# Patient Record
Sex: Male | Born: 1950 | Race: Black or African American | Hispanic: No | Smoking: Never smoker
Health system: Southern US, Community
[De-identification: ages and names within clinical notes are randomized; demographics above are authoritative.]

---

## 2019-11-18 ENCOUNTER — Encounter (HOSPITAL_COMMUNITY): Payer: Self-pay | Admitting: Emergency Medicine

## 2019-11-18 ENCOUNTER — Other Ambulatory Visit: Payer: Self-pay

## 2019-11-18 ENCOUNTER — Inpatient Hospital Stay (HOSPITAL_COMMUNITY)
Admission: EM | Admit: 2019-11-18 | Discharge: 2019-11-21 | DRG: 193 | Discharge status: Against Medical Advice | Payer: Medicare Other | Attending: Internal Medicine | Admitting: Internal Medicine

## 2019-11-18 ENCOUNTER — Emergency Department (HOSPITAL_COMMUNITY): Payer: Medicare Other

## 2019-11-18 DIAGNOSIS — R739 Hyperglycemia, unspecified: Secondary | ICD-10-CM | POA: Diagnosis present

## 2019-11-18 DIAGNOSIS — E785 Hyperlipidemia, unspecified: Secondary | ICD-10-CM | POA: Diagnosis present

## 2019-11-18 DIAGNOSIS — Z79899 Other long term (current) drug therapy: Secondary | ICD-10-CM

## 2019-11-18 DIAGNOSIS — J189 Pneumonia, unspecified organism: Secondary | ICD-10-CM | POA: Diagnosis not present

## 2019-11-18 DIAGNOSIS — D696 Thrombocytopenia, unspecified: Secondary | ICD-10-CM | POA: Diagnosis present

## 2019-11-18 DIAGNOSIS — J9601 Acute respiratory failure with hypoxia: Secondary | ICD-10-CM | POA: Diagnosis present

## 2019-11-18 DIAGNOSIS — M109 Gout, unspecified: Secondary | ICD-10-CM

## 2019-11-18 DIAGNOSIS — R945 Abnormal results of liver function studies: Secondary | ICD-10-CM

## 2019-11-18 DIAGNOSIS — I10 Essential (primary) hypertension: Secondary | ICD-10-CM

## 2019-11-18 DIAGNOSIS — E872 Acidosis: Secondary | ICD-10-CM | POA: Diagnosis present

## 2019-11-18 DIAGNOSIS — R0902 Hypoxemia: Secondary | ICD-10-CM

## 2019-11-18 DIAGNOSIS — E7800 Pure hypercholesterolemia, unspecified: Secondary | ICD-10-CM

## 2019-11-18 DIAGNOSIS — R7989 Other specified abnormal findings of blood chemistry: Secondary | ICD-10-CM

## 2019-11-18 DIAGNOSIS — Z20822 Contact with and (suspected) exposure to covid-19: Secondary | ICD-10-CM

## 2019-11-18 DIAGNOSIS — R0602 Shortness of breath: Secondary | ICD-10-CM

## 2019-11-18 DIAGNOSIS — D72819 Decreased white blood cell count, unspecified: Secondary | ICD-10-CM | POA: Diagnosis present

## 2019-11-18 DIAGNOSIS — Z20828 Contact with and (suspected) exposure to other viral communicable diseases: Secondary | ICD-10-CM | POA: Diagnosis present

## 2019-11-18 DIAGNOSIS — N179 Acute kidney failure, unspecified: Secondary | ICD-10-CM | POA: Diagnosis present

## 2019-11-18 HISTORY — DX: Essential (primary) hypertension: I10

## 2019-11-18 HISTORY — DX: Gout, unspecified: M10.9

## 2019-11-18 HISTORY — DX: Pure hypercholesterolemia, unspecified: E78.00

## 2019-11-18 LAB — TROPONIN I (HIGH SENSITIVITY): Troponin I (High Sensitivity): 7 ng/L (ref <18)

## 2019-11-18 LAB — CBC WITH DIFFERENTIAL/PLATELET
Abs Immature Granulocytes: 0.02 10*3/uL (ref 0.00–0.07)
Basophils Absolute: 0 10*3/uL (ref 0.0–0.1)
Basophils Relative: 0 %
Eosinophils Absolute: 0 10*3/uL (ref 0.0–0.5)
Eosinophils Relative: 0 %
HCT: 48.4 % (ref 39.0–52.0)
Hemoglobin: 15.7 g/dL (ref 13.0–17.0)
Immature Granulocytes: 0 %
Lymphocytes Relative: 19 %
Lymphs Abs: 1.2 10*3/uL (ref 0.7–4.0)
MCH: 31.6 pg (ref 26.0–34.0)
MCHC: 32.4 g/dL (ref 30.0–36.0)
MCV: 97.4 fL (ref 80.0–100.0)
Monocytes Absolute: 0.4 10*3/uL (ref 0.1–1.0)
Monocytes Relative: 6 %
Neutro Abs: 4.5 10*3/uL (ref 1.7–7.7)
Neutrophils Relative %: 75 %
Platelets: 145 10*3/uL — ABNORMAL LOW (ref 150–400)
RBC: 4.97 MIL/uL (ref 4.22–5.81)
RDW: 12.7 % (ref 11.5–15.5)
WBC: 6.1 10*3/uL (ref 4.0–10.5)
nRBC: 0 % (ref 0.0–0.2)

## 2019-11-18 LAB — COMPREHENSIVE METABOLIC PANEL
ALT: 30 U/L (ref 0–44)
AST: 42 U/L — ABNORMAL HIGH (ref 15–41)
Albumin: 3.4 g/dL — ABNORMAL LOW (ref 3.5–5.0)
Alkaline Phosphatase: 52 U/L (ref 38–126)
Anion gap: 11 (ref 5–15)
BUN: 19 mg/dL (ref 8–23)
CO2: 24 mmol/L (ref 22–32)
Calcium: 8.3 mg/dL — ABNORMAL LOW (ref 8.9–10.3)
Chloride: 101 mmol/L (ref 98–111)
Creatinine, Ser: 1.7 mg/dL — ABNORMAL HIGH (ref 0.61–1.24)
GFR calc Af Amer: 47 mL/min — ABNORMAL LOW (ref >60)
GFR calc non Af Amer: 41 mL/min — ABNORMAL LOW (ref >60)
Glucose, Bld: 112 mg/dL — ABNORMAL HIGH (ref 70–99)
Potassium: 3.9 mmol/L (ref 3.5–5.1)
Sodium: 136 mmol/L (ref 135–145)
Total Bilirubin: 0.8 mg/dL (ref 0.3–1.2)
Total Protein: 7.2 g/dL (ref 6.5–8.1)

## 2019-11-18 LAB — BRAIN NATRIURETIC PEPTIDE: B Natriuretic Peptide: 32.3 pg/mL (ref 0.0–100.0)

## 2019-11-18 LAB — POC SARS CORONAVIRUS 2 AG -  ED: SARS Coronavirus 2 Ag: NEGATIVE

## 2019-11-18 NOTE — ED Triage Notes (Signed)
Pt states he took his blood pressure medicine on Tuesday, and felt light headed, and fell and hit his head. Pt also states that SOB began on Tuesday. He is coming in for worsening SOB today. Pt states that he has never had a Covid test.

## 2019-11-18 NOTE — ED Provider Notes (Signed)
[facility]-EMERGENCY DEPT Provider Note   CSN: [id number] Arrival date & time: 11/18/19  2212     History   Chief Complaint Chief Complaint  Patient presents with  . Shortness of Breath    HPI Brandon Ellison is a 68 y.o. male with a hx of hypertension, high cholesterol, gout presents to the Emergency Department complaining of gradual, persistent, progressively worsening this of breath onset approximately 3 days ago.  Patient reports he is had associated cough but denies known fever, chills, nasal congestion, sore throat and abdominal pain, nausea and vomiting.  Patient states he does not have shortness of breath at rest but does if he gets up and walks around the house.  Additionally, he reports that on Tuesday he took a second dose of his hypertension medications because he was going to the doctor and wanted his blood pressure to be "good."  Patient reports he had a syncopal episode at that time but has had no recurrent episodes.  He denies headache, neck pain or back pain.  He denies vision changes, gait changes, numbness, tingling or weakness.  Patient denies anticoagulant usage.  He denies a cardiac history or history of congestive heart failure.  He denies peripheral edema.  He denies chest pain.     The history is provided by the patient and medical records. No language interpreter was used.    History reviewed. No pertinent past medical history.  Patient Active Problem List   Diagnosis Date Noted  . HTN (hypertension) 11/18/2019  . High cholesterol 11/18/2019  . Gout 11/18/2019      Home Medications    Prior to Admission medications   Medication Sig Start Date End Date Taking? Authorizing Provider  allopurinol (ZYLOPRIM) 100 MG tablet Take 100 mg by mouth daily. 10/24/19  Yes [provider]  amLODipine (NORVASC) 10 MG tablet Take 10 mg by mouth daily. 10/24/19  Yes [provider]  atorvastatin (LIPITOR) 40 MG tablet Take 40 mg by  mouth daily. 10/23/19  Yes [provider]  labetalol (NORMODYNE) 200 MG tablet Take 200 mg by mouth 2 (two) times daily. 10/24/19  Yes [provider]  losartan (COZAAR) 50 MG tablet Take 50 mg by mouth daily. 10/23/19  Yes [provider]    Family History History reviewed. No pertinent family history.  Social History Social History   Tobacco Use  . Smoking status: Never Smoker  . Smokeless tobacco: Never Used  Substance Use Topics  . Alcohol use: Not Currently    Frequency: Never  . Drug use: Not on file     Allergies   Patient has no known allergies.   Review of Systems Review of Systems  Constitutional: Positive for fever. Negative for appetite change, diaphoresis, fatigue and unexpected weight change.  HENT: Negative for mouth sores.   Eyes: Negative for visual disturbance.  Respiratory: Positive for cough and shortness of breath. Negative for chest tightness and wheezing.   Cardiovascular: Negative for chest pain.  Gastrointestinal: Negative for abdominal pain, constipation, diarrhea, nausea and vomiting.  Endocrine: Negative for polydipsia, polyphagia and polyuria.  Genitourinary: Negative for dysuria, frequency, hematuria and urgency.  Musculoskeletal: Negative for back pain and neck stiffness.  Skin: Negative for rash.  Allergic/Immunologic: Negative for immunocompromised state.  Neurological: Negative for syncope, light-headedness and headaches.  Hematological: Does not bruise/bleed easily.  Psychiatric/Behavioral: Negative for sleep disturbance. The patient is not nervous/anxious.      Physical Exam Updated Vital Signs BP 128/89  Pulse 75   Temp (!) 100.5 F (38.1 C) (Rectal)   Resp (!) 32   Ht 6\' 2"  (1.88 m)   Wt 84.8 kg   SpO2 95%   BMI 24.01 kg/m   Physical Exam Vitals signs and nursing note reviewed.  Constitutional:      General: He is not in acute distress.    Appearance: He is not diaphoretic.  HENT:      Head: Normocephalic.  Eyes:     General: No scleral icterus.    Conjunctiva/sclera: Conjunctivae normal.  Neck:     Musculoskeletal: Normal range of motion.  Cardiovascular:     Rate and Rhythm: Normal rate and regular rhythm.     Pulses: Normal pulses.          Radial pulses are 2+ on the right side and 2+ on the left side.  Pulmonary:     Effort: Tachypnea present. No accessory muscle usage, prolonged expiration, respiratory distress or retractions.     Breath sounds: No stridor.     Comments: Equal chest rise. No increased work of breathing. Abdominal:     General: There is no distension.     Palpations: Abdomen is soft.     Tenderness: There is no abdominal tenderness. There is no guarding or rebound.  Musculoskeletal:     Right lower leg: No edema.     Left lower leg: No edema.     Comments: Moves all extremities equally and without difficulty. No peripheral edema or calf tenderness.  Skin:    General: Skin is warm and dry.     Capillary Refill: Capillary refill takes less than 2 seconds.  Neurological:     Mental Status: He is alert.     GCS: GCS eye subscore is 4. GCS verbal subscore is 5. GCS motor subscore is 6.     Comments: Speech is clear and goal oriented.  Psychiatric:        Mood and Affect: Mood normal.      ED Treatments / Results  Labs (all labs ordered are listed, but only abnormal results are displayed) Labs Reviewed  CBC WITH DIFFERENTIAL/PLATELET - Abnormal; Notable for the following components:      Result Value   Platelets 145 (*)    All other components within normal limits  COMPREHENSIVE METABOLIC PANEL - Abnormal; Notable for the following components:   Glucose, Bld 112 (*)    Creatinine, Ser 1.70 (*)    Calcium 8.3 (*)    Albumin 3.4 (*)    AST 42 (*)    GFR calc non Af Amer 41 (*)    GFR calc Af Amer 47 (*)    All other components within normal limits  NOVEL CORONAVIRUS, NAA (HOSP ORDER, SEND-OUT TO REF LAB; TAT 18-24 HRS)  BRAIN  NATRIURETIC PEPTIDE  LIPASE, BLOOD  POC SARS CORONAVIRUS 2 AG -  ED  TROPONIN I (HIGH SENSITIVITY)  TROPONIN I (HIGH SENSITIVITY)    EKG EKG Interpretation  Date/Time:  Friday November 18 2019 23:10:39 EST Ventricular Rate:  73 PR Interval:    QRS Duration: 88 QT Interval:  411 QTC Calculation: 453 R Axis:   42 Text Interpretation: Sinus rhythm Left atrial enlargement Anterior infarct, old No STEMI Confirmed by Nanda Quinton [phone]) on 11/19/2019 12:15:18 AM   Radiology Dg Chest Port 1 View  Result Date: 11/18/2019 CLINICAL DATA:  Cough, shortness of breath EXAM: PORTABLE CHEST 1 VIEW COMPARISON:  Radiograph Apr 29, 2016, CT August 19, 2015 FINDINGS: Basilar predominant interstitial and developing airspace opacity with some peripheral septal lines and central vascular congestion and cuffing. Cardiac silhouette is somewhat prominent though this may be accentuated by portable technique. The aorta is calcified and tortuous. No pneumothorax or effusion. No acute osseous or soft tissue abnormality. IMPRESSION: Findings suspicious for developing pulmonary edema. Early infection could have a similar appearance and should be excluded clinically. Aortic Atherosclerosis (ICD10-I70.0). Electronically Signed   By: Kreg ShropshirePrice  DeHay M.D.   On: 11/18/2019 23:38    Procedures Procedures (including critical care time)  Medications Ordered in ED Medications  azithromycin (ZITHROMAX) 500 mg in sodium chloride 0.9 % 250 mL IVPB (500 mg Intravenous New Bag/Given 11/19/19 0333)  acetaminophen (TYLENOL) tablet 650 mg (650 mg Oral Given 11/19/19 0019)  cefTRIAXone (ROCEPHIN) 1 g in sodium chloride 0.9 % 100 mL IVPB (0 g Intravenous Stopped 11/19/19 0332)     Initial Impression / Assessment and Plan / ED Course  I have reviewed the triage vital signs and the nursing notes.  Pertinent labs & imaging results that were available during my care of the patient were reviewed by me and considered in my medical  decision making (see chart for details).  Clinical Course as of Nov 18 342  Fri Nov 18, 2019  2252 PCP: Patria ManeMike Gassemi, MD   [HM]  2252 Meds:Losartan, Allopurinol, Atorvastatin, Amlodipine, Labetalol   [HM]  Sat Nov 19, 2019  0224 Pt walks without significant shortness of breath.  SPO2 93%   [HM]  0224 Tachypnea improved.    Resp: 17 [HM]    Clinical Course User Index [HM] Keyshla Tunison, Dahlia ClientHannah, PA-C        Brandon Ellison was evaluated in Emergency Department on 11/19/2019 for the symptoms described in the history of present illness. He was evaluated in the context of the global COVID-19 pandemic, which necessitated consideration that the patient might be at risk for infection with the SARS-CoV-2 virus that causes COVID-19. Institutional protocols and algorithms that pertain to the evaluation of patients at risk for COVID-19 are in a state of rapid change based on information released by regulatory bodies including the CDC and federal and state organizations. These policies and algorithms were followed during the patient's care in the ED.   Patient presents with Covid-like illness.  Patient febrile and tachypneic but without tachycardia or hypoxia at rest.  Labs are largely reassuring.  EKG without acute ischemia.  BNP within normal limits and normal troponin.  No clinical evidence of heart failure.  Patient with elevated serum creatinine however unknown baseline.  He is eating and drinking well and has moist mucous membranes.  Will hold on fluids at this time as he is likely Covid.  Rapid test is negative however I have high suspicion.  Confirmatory PCR has been sent.  2:54 AM Patient with persistent tachypnea in the 30s and oxygen saturation 89-90% at rest with good [place]. Patient ambulated on room air with oxygen saturations between 90-93% however he became significantly short of breath and had to stop walking.   Patient placed on 2 L via nasal cannula with improvement to 95%. Chest x-ray does  show questionable pneumonia versus Covid.  Antibiotics ordered to cover for potential community-acquired pneumonia. He will need admission.  The patient was discussed with Dr. Jacqulyn BathLong who agrees with the treatment plan.   Final Clinical Impressions(s) / ED Diagnoses   Final diagnoses:  Suspected COVID-19 virus infection  Shortness of breath  Hypoxia    ED Discharge  Orders    None       Milta Deiters 11/19/19 9604    Long, Arlyss Repress, MD 11/19/19 1031

## 2019-11-19 ENCOUNTER — Encounter (HOSPITAL_COMMUNITY): Payer: Self-pay | Admitting: Internal Medicine

## 2019-11-19 DIAGNOSIS — E785 Hyperlipidemia, unspecified: Secondary | ICD-10-CM | POA: Diagnosis present

## 2019-11-19 DIAGNOSIS — E7800 Pure hypercholesterolemia, unspecified: Secondary | ICD-10-CM | POA: Diagnosis present

## 2019-11-19 DIAGNOSIS — M109 Gout, unspecified: Secondary | ICD-10-CM

## 2019-11-19 DIAGNOSIS — J189 Pneumonia, unspecified organism: Secondary | ICD-10-CM | POA: Diagnosis present

## 2019-11-19 DIAGNOSIS — D696 Thrombocytopenia, unspecified: Secondary | ICD-10-CM | POA: Diagnosis present

## 2019-11-19 DIAGNOSIS — Z20828 Contact with and (suspected) exposure to other viral communicable diseases: Secondary | ICD-10-CM | POA: Diagnosis present

## 2019-11-19 DIAGNOSIS — Z79899 Other long term (current) drug therapy: Secondary | ICD-10-CM | POA: Diagnosis not present

## 2019-11-19 DIAGNOSIS — J9601 Acute respiratory failure with hypoxia: Secondary | ICD-10-CM | POA: Diagnosis present

## 2019-11-19 DIAGNOSIS — R739 Hyperglycemia, unspecified: Secondary | ICD-10-CM | POA: Diagnosis present

## 2019-11-19 DIAGNOSIS — I10 Essential (primary) hypertension: Secondary | ICD-10-CM | POA: Diagnosis present

## 2019-11-19 DIAGNOSIS — R0602 Shortness of breath: Secondary | ICD-10-CM | POA: Diagnosis present

## 2019-11-19 DIAGNOSIS — M1020 Drug-induced gout, unspecified site: Secondary | ICD-10-CM | POA: Diagnosis not present

## 2019-11-19 DIAGNOSIS — N179 Acute kidney failure, unspecified: Secondary | ICD-10-CM | POA: Diagnosis present

## 2019-11-19 DIAGNOSIS — D72819 Decreased white blood cell count, unspecified: Secondary | ICD-10-CM | POA: Diagnosis present

## 2019-11-19 DIAGNOSIS — E872 Acidosis: Secondary | ICD-10-CM | POA: Diagnosis present

## 2019-11-19 LAB — PROCALCITONIN: Procalcitonin: <0.1 ng/mL

## 2019-11-19 LAB — LACTATE DEHYDROGENASE: LDH: 283 U/L — ABNORMAL HIGH (ref 98–192)

## 2019-11-19 LAB — NOVEL CORONAVIRUS, NAA (HOSP ORDER, SEND-OUT TO REF LAB; TAT 18-24 HRS): SARS-CoV-2, NAA: NOT DETECTED

## 2019-11-19 LAB — D-DIMER, QUANTITATIVE: D-Dimer, Quant: 0.97 ug/mL-FEU — ABNORMAL HIGH (ref 0.00–0.50)

## 2019-11-19 LAB — C-REACTIVE PROTEIN: CRP: 13.3 mg/dL — ABNORMAL HIGH (ref <1.0)

## 2019-11-19 LAB — FIBRINOGEN: Fibrinogen: 793 mg/dL — ABNORMAL HIGH (ref 210–475)

## 2019-11-19 LAB — FERRITIN: Ferritin: 1057 ng/mL — ABNORMAL HIGH (ref 24–336)

## 2019-11-19 LAB — TROPONIN I (HIGH SENSITIVITY): Troponin I (High Sensitivity): 6 ng/L

## 2019-11-19 LAB — LIPASE, BLOOD: Lipase: 41 U/L (ref 11–51)

## 2019-11-19 MED ORDER — SODIUM CHLORIDE 0.9 % IV SOLN
1.0000 g | Freq: Once | INTRAVENOUS | Status: AC
  Administered 2019-11-19: 1 g via INTRAVENOUS
  Filled 2019-11-19: qty 10

## 2019-11-19 MED ORDER — LABETALOL HCL 200 MG PO TABS
200.0000 mg | ORAL_TABLET | Freq: Two times a day (BID) | ORAL | Status: DC
  Administered 2019-11-20 (×3): 200 mg via ORAL
  Filled 2019-11-19 (×3): qty 1

## 2019-11-19 MED ORDER — AMLODIPINE BESYLATE 10 MG PO TABS
10.0000 mg | ORAL_TABLET | Freq: Every day | ORAL | Status: DC
  Administered 2019-11-20: 10 mg via ORAL
  Filled 2019-11-19: qty 1

## 2019-11-19 MED ORDER — DEXAMETHASONE SODIUM PHOSPHATE 10 MG/ML IJ SOLN
6.0000 mg | INTRAMUSCULAR | Status: DC
  Administered 2019-11-19 – 2019-11-20 (×2): 6 mg via INTRAVENOUS
  Filled 2019-11-19 (×2): qty 1

## 2019-11-19 MED ORDER — ONDANSETRON HCL 4 MG/2ML IJ SOLN: 4.0000 mg | Freq: Four times a day (QID) | INTRAMUSCULAR | Status: DC | PRN

## 2019-11-19 MED ORDER — ACETAMINOPHEN 325 MG PO TABS
650.0000 mg | ORAL_TABLET | Freq: Once | ORAL | Status: AC
  Administered 2019-11-19: 650 mg via ORAL
  Filled 2019-11-19: qty 2

## 2019-11-19 MED ORDER — POLYETHYLENE GLYCOL 3350 17 G PO PACK: 17.0000 g | PACK | Freq: Every day | ORAL | Status: DC | PRN

## 2019-11-19 MED ORDER — ENOXAPARIN SODIUM 40 MG/0.4ML ~~LOC~~ SOLN
40.0000 mg | SUBCUTANEOUS | Status: DC
  Administered 2019-11-19 – 2019-11-20 (×2): 40 mg via SUBCUTANEOUS
  Filled 2019-11-19 (×2): qty 0.4

## 2019-11-19 MED ORDER — ALLOPURINOL 100 MG PO TABS
100.0000 mg | ORAL_TABLET | Freq: Every day | ORAL | Status: DC
  Administered 2019-11-20: 100 mg via ORAL
  Filled 2019-11-19: qty 1

## 2019-11-19 MED ORDER — SODIUM CHLORIDE 0.9 % IV SOLN
500.0000 mg | Freq: Once | INTRAVENOUS | Status: AC
  Administered 2019-11-19: 500 mg via INTRAVENOUS
  Filled 2019-11-19: qty 500

## 2019-11-19 MED ORDER — ACETAMINOPHEN 325 MG PO TABS
650.0000 mg | ORAL_TABLET | Freq: Four times a day (QID) | ORAL | Status: DC | PRN
  Administered 2019-11-20 (×4): 650 mg via ORAL
  Filled 2019-11-19 (×4): qty 2

## 2019-11-19 MED ORDER — SODIUM CHLORIDE 0.9 % IV SOLN
500.0000 mg | INTRAVENOUS | Status: DC
  Administered 2019-11-20: 500 mg via INTRAVENOUS
  Filled 2019-11-19: qty 500

## 2019-11-19 MED ORDER — SODIUM CHLORIDE 0.9 % IV SOLN
INTRAVENOUS | Status: AC
  Administered 2019-11-20 (×2): via INTRAVENOUS

## 2019-11-19 MED ORDER — SODIUM CHLORIDE 0.9 % IV SOLN
1.0000 g | INTRAVENOUS | Status: DC
  Administered 2019-11-20: 1 g via INTRAVENOUS
  Filled 2019-11-19: qty 1

## 2019-11-19 MED ORDER — ACETAMINOPHEN 650 MG RE SUPP: 650.0000 mg | Freq: Four times a day (QID) | RECTAL | Status: DC | PRN

## 2019-11-19 MED ORDER — ORAL CARE MOUTH RINSE
15.0000 mL | Freq: Two times a day (BID) | OROMUCOSAL | Status: DC
  Administered 2019-11-20 (×3): 15 mL via OROMUCOSAL

## 2019-11-19 MED ORDER — ATORVASTATIN CALCIUM 40 MG PO TABS
40.0000 mg | ORAL_TABLET | Freq: Every day | ORAL | Status: DC
  Administered 2019-11-20: 40 mg via ORAL
  Filled 2019-11-19: qty 1

## 2019-11-19 MED ORDER — ONDANSETRON HCL 4 MG PO TABS: 4.0000 mg | ORAL_TABLET | Freq: Four times a day (QID) | ORAL | Status: DC | PRN

## 2019-11-19 MED ORDER — ALBUTEROL SULFATE (2.5 MG/3ML) 0.083% IN NEBU: 2.5000 mg | INHALATION_SOLUTION | RESPIRATORY_TRACT | Status: DC | PRN

## 2019-11-19 NOTE — ED Notes (Signed)
Daughter requests call when room is assigned: [place] [phone]

## 2019-11-19 NOTE — H&P (Addendum)
[facility] History and Physical  Bauer Ausborn KZL:[id number] DOB: 1951/11/09 DOA: 11/18/2019  Referring physician: ED  PCP: Patient, No Pcp Per   Chief Complaint: Shortness of breath x 3 days  HPI: Brandon Ellison is a 68 y.o. male with past medical history of hypertension, hyperlipidemia, gout presented to the hospital with worsening shortness of breath for the last 3 days.  Patient states that his dyspnea was associated with cough but denied any fever, chills or rigor.  He denies any sore throat.  No nausea, vomiting or abdominal pain.  Patient denies any headache, fever or chills.  Patient denies any history of pulmonary issues or congestive heart failure.  No recent weight gain or peripheral edema.  Patient denies any chest pain, palpitation.  He stated that on Tuesday he felt lightheaded and fell and hit his head.  Patient denies any urinary urgency, frequency or dysuria.  Patient denies any syncope or loss of consciousness.  ED Course: In the ED, patient was noted to be hypoxic, febrile and tachypneic and was placed on nasal cannula oxygen.Marland Kitchen He was tachypneic at 30/min and oxygen saturation was 89 to 90% on rest..  Pulse ox improved to 95% on 2 L of nasal cannula.  Patient did have a mild thrombocytopenia as well.  Creatinine was elevated at 1.7.  Covid 19 PCR was sent from the ED which is pending.  Rapid Covid was negative.  In the ED, patient received Rocephin and Zithromax and Tylenol for multifocal pneumonia.  Review of Systems:  All systems were reviewed and were negative unless otherwise mentioned in the HPI  Past Medical History:  Diagnosis Date  . Gout 11/18/2019  . High cholesterol 11/18/2019  . HTN (hypertension) 11/18/2019   History reviewed. No pertinent surgical history.  Social History:  reports that he has never smoked. He has never used smokeless tobacco. He reports previous alcohol use. No history on file for drug.  No Known Allergies  History reviewed. No  pertinent family history.   Prior to Admission medications   Medication Sig Start Date End Date Taking? Authorizing Provider  allopurinol (ZYLOPRIM) 100 MG tablet Take 100 mg by mouth daily. 10/24/19  Yes [provider]  amLODipine (NORVASC) 10 MG tablet Take 10 mg by mouth daily. 10/24/19  Yes [provider]  atorvastatin (LIPITOR) 40 MG tablet Take 40 mg by mouth daily. 10/23/19  Yes [provider]  labetalol (NORMODYNE) 200 MG tablet Take 200 mg by mouth 2 (two) times daily. 10/24/19  Yes [provider]  losartan (COZAAR) 50 MG tablet Take 50 mg by mouth daily. 10/23/19  Yes [provider]    Physical Exam: Vitals:   11/19/19 0645 11/19/19 0700 11/19/19 0715 11/19/19 0730  BP:  131/79  140/80  Pulse: 73 71 65 67  Resp: (!) 21 (!) 33 (!) 22 (!) 22  Temp:      TempSrc:      SpO2: 96% 91% 94% 93%  Weight:      Height:       Wt Readings from Last 3 Encounters:  11/18/19 84.8 kg   Body mass index is 24.01 kg/m.  General:  Average built, not in obvious distress, on nasal cannula oxygen. HENT: Normocephalic, pupils equally reacting to light and accommodation.  No scleral pallor or icterus noted. Oral mucosa is moist.  Chest:   Diminished breath sounds bilaterally. No crackles or wheezes.  CVS: S1 &S2 heard. No murmur.  Regular rate and rhythm. Abdomen: Soft, nontender,  nondistended.  Bowel sounds are heard.  Liver is not palpable, no abdominal mass palpated Extremities: No cyanosis, clubbing or edema.  Peripheral pulses are palpable. Psych: Alert, awake and oriented, normal mood CNS:  No cranial nerve deficits.  Power equal in all extremities.   No cerebellar signs.   Skin: Warm and dry.  No rashes noted.  Labs on Admission:   CBC: Recent Labs  Lab 11/18/19 2309  WBC 6.1  NEUTROABS 4.5  HGB 15.7  HCT 48.4  MCV 97.4  PLT 145*    Basic Metabolic Panel: Recent Labs  Lab 11/18/19 2309  NA 136  K 3.9  CL 101  CO2 24   GLUCOSE 112*  BUN 19  CREATININE 1.70*  CALCIUM 8.3*    Liver Function Tests: Recent Labs  Lab 11/18/19 2309  AST 42*  ALT 30  ALKPHOS 52  BILITOT 0.8  PROT 7.2  ALBUMIN 3.4*   Recent Labs  Lab 11/18/19 2309  LIPASE 41   No results for input(s): AMMONIA in the last 168 hours.  Cardiac Enzymes: No results for input(s): CKTOTAL, CKMB, CKMBINDEX, TROPONINI in the last 168 hours.  BNP (last 3 results) Recent Labs    11/18/19 2309  BNP 32.3    ProBNP (last 3 results) No results for input(s): PROBNP in the last 8760 hours.  CBG: No results for input(s): GLUCAP in the last 168 hours.  Lipase     Component Value Date/Time   LIPASE 41 11/18/2019 2309     Urinalysis No results found for: COLORURINE, APPEARANCEUR, LABSPEC, PHURINE, GLUCOSEU, HGBUR, BILIRUBINUR, KETONESUR, PROTEINUR, UROBILINOGEN, NITRITE, LEUKOCYTESUR   Drugs of Abuse  No results found for: LABOPIA, COCAINSCRNUR, LABBENZ, AMPHETMU, THCU, LABBARB    Radiological Exams on Admission: Dg Chest Port 1 View  Result Date: 11/18/2019 CLINICAL DATA:  Cough, shortness of breath EXAM: PORTABLE CHEST 1 VIEW COMPARISON:  Radiograph Apr 29, 2016, CT August 19, 2015 FINDINGS: Basilar predominant interstitial and developing airspace opacity with some peripheral septal lines and central vascular congestion and cuffing. Cardiac silhouette is somewhat prominent though this may be accentuated by portable technique. The aorta is calcified and tortuous. No pneumothorax or effusion. No acute osseous or soft tissue abnormality. IMPRESSION: Findings suspicious for developing pulmonary edema. Early infection could have a similar appearance and should be excluded clinically. Aortic Atherosclerosis (ICD10-I70.0). Electronically Signed   By: Kreg ShropshirePrice  DeHay M.D.   On: 11/18/2019 23:38    EKG: Personally reviewed by me which show normal sinus rhythm.  Assessment/Plan Principal Problem:   Multifocal pneumonia Active Problems:    HTN (hypertension)   High cholesterol   Gout  Multifocal pneumonia with acute hypoxic respiratory failure.  Patient did have dyspnea hypoxia on presentation and was subsequently febrile up to 100.5 F.  Covid point-of-care negative.  PCR pending.  Continue supplemental oxygen.  Check inflammatory biomarkers.  WBC within normal limits.  BNP within normal limits.  Albuterol inhaler for now.  Check procalcitonin.  Continue Rocephin and Zithromax.  AST slightly elevated.  Troponin negative.  BNP was 32  Mild thrombocytopenia.  Will closely monitor.  Hyperlipidemia.  Continue Lipitor.  Essential hypertension.  Continue amlodipine, labetalol.  Continue to monitor blood pressure.  Hold losartan for now.  Elevated creatinine levels.  Hold losartan for now.  Hold allopurinol.  Check BMP closely.  No previous labs in the computer.    Possible acute kidney injury.  Continue with IV fluid hydration for now.  Continue to monitor creatinine levels.  Unknown  baseline continue levels.  History of gout on allopurinol.  No acute flare.  Hold allopurinol for now.  Addendum:  11/19/2019 4:08 PM   Inflammatory markers reviewed and elevated.  At this time, PCR is pending.  High suspicion for Covid since he does not have any alternative reasons for hypoxia except for regular [employer] pneumonia..  We will start the patient on iv dexamethasone and consult pharmacy for remdesivir if possible.  Continue isolation precautions.  DVT Prophylaxis: Lovenox  Consultant: None  Code Status: Full code  Microbiology  blood culture sent from the ED COVID-19 PCR pending.  Antibiotics: Rocephin and Zithromax  Family Communication:  Patients' condition and plan of care including tests being ordered have been discussed with the patient who indicate understanding and agree with the plan.  Disposition Plan: Home  Severity of Illness: The appropriate patient status for this patient is INPATIENT. Inpatient status is  judged to be reasonable and necessary in order to provide the required intensity of service to ensure the patient's safety. The patient's presenting symptoms, physical exam findings, and initial radiographic and laboratory data in the context of their chronic comorbidities is felt to place them at high risk for further clinical deterioration. Furthermore, it is not anticipated that the patient will be medically stable for discharge from the hospital within 2 midnights of admission. The following factors support the patient status of inpatient. I certify that at the point of admission it is my clinical judgment that the patient will require inpatient hospital care spanning beyond 2 midnights from the point of admission due to high intensity of service, high risk for further deterioration and high frequency of surveillance required.   Signed, Flora Lipps, MD [facility] 11/19/2019

## 2019-11-19 NOTE — ED Notes (Signed)
SpO2 93% when ambulating

## 2019-11-20 ENCOUNTER — Inpatient Hospital Stay (HOSPITAL_COMMUNITY): Payer: Medicare Other

## 2019-11-20 LAB — COMPREHENSIVE METABOLIC PANEL
ALT: 40 U/L (ref 0–44)
AST: 54 U/L — ABNORMAL HIGH (ref 15–41)
Albumin: 3 g/dL — ABNORMAL LOW (ref 3.5–5.0)
Alkaline Phosphatase: 57 U/L (ref 38–126)
Anion gap: 11 (ref 5–15)
BUN: 19 mg/dL (ref 8–23)
CO2: 22 mmol/L (ref 22–32)
Calcium: 8.3 mg/dL — ABNORMAL LOW (ref 8.9–10.3)
Chloride: 103 mmol/L (ref 98–111)
Creatinine, Ser: 1.49 mg/dL — ABNORMAL HIGH (ref 0.61–1.24)
GFR calc Af Amer: 55 mL/min — ABNORMAL LOW (ref >60)
GFR calc non Af Amer: 48 mL/min — ABNORMAL LOW (ref >60)
Glucose, Bld: 113 mg/dL — ABNORMAL HIGH (ref 70–99)
Potassium: 3.8 mmol/L (ref 3.5–5.1)
Sodium: 136 mmol/L (ref 135–145)
Total Bilirubin: 0.6 mg/dL (ref 0.3–1.2)
Total Protein: 6.9 g/dL (ref 6.5–8.1)

## 2019-11-20 LAB — RESPIRATORY PANEL BY PCR

## 2019-11-20 LAB — SEDIMENTATION RATE: Sed Rate: 77 mm/hr — ABNORMAL HIGH (ref 0–16)

## 2019-11-20 LAB — PROCALCITONIN: Procalcitonin: <0.1 ng/mL

## 2019-11-20 LAB — D-DIMER, QUANTITATIVE: D-Dimer, Quant: 1.24 ug/mL-FEU — ABNORMAL HIGH (ref 0.00–0.50)

## 2019-11-20 LAB — INFLUENZA PANEL BY PCR (TYPE A & B)
Influenza A By PCR: NEGATIVE
Influenza B By PCR: NEGATIVE

## 2019-11-20 LAB — CBC
HCT: 45.3 % (ref 39.0–52.0)
Hemoglobin: 14.6 g/dL (ref 13.0–17.0)
MCH: 31.5 pg (ref 26.0–34.0)
MCHC: 32.2 g/dL (ref 30.0–36.0)
MCV: 97.6 fL (ref 80.0–100.0)
Platelets: 166 10*3/uL (ref 150–400)
RBC: 4.64 MIL/uL (ref 4.22–5.81)
RDW: 12.6 % (ref 11.5–15.5)
WBC: 5.4 10*3/uL (ref 4.0–10.5)
nRBC: 0 % (ref 0.0–0.2)

## 2019-11-20 LAB — HEMOGLOBIN A1C
Hgb A1c MFr Bld: 6 % — ABNORMAL HIGH (ref 4.8–5.6)
Mean Plasma Glucose: 125.5 mg/dL

## 2019-11-20 LAB — FIBRINOGEN: Fibrinogen: 746 mg/dL — ABNORMAL HIGH (ref 210–475)

## 2019-11-20 LAB — FERRITIN: Ferritin: 1086 ng/mL — ABNORMAL HIGH (ref 24–336)

## 2019-11-20 LAB — C-REACTIVE PROTEIN: CRP: 13 mg/dL — ABNORMAL HIGH (ref <1.0)

## 2019-11-20 LAB — HIV ANTIBODY (ROUTINE TESTING W REFLEX): HIV Screen 4th Generation wRfx: NONREACTIVE

## 2019-11-20 LAB — LACTATE DEHYDROGENASE: LDH: 280 U/L — ABNORMAL HIGH (ref 98–192)

## 2019-11-20 MED ORDER — IPRATROPIUM-ALBUTEROL 0.5-2.5 (3) MG/3ML IN SOLN
3.0000 mL | Freq: Four times a day (QID) | RESPIRATORY_TRACT | Status: DC
  Administered 2019-11-20: 3 mL via RESPIRATORY_TRACT
  Filled 2019-11-20 (×2): qty 3

## 2019-11-20 MED ORDER — ALBUTEROL SULFATE (2.5 MG/3ML) 0.083% IN NEBU: 2.5000 mg | INHALATION_SOLUTION | RESPIRATORY_TRACT | Status: DC | PRN

## 2019-11-20 MED ORDER — IPRATROPIUM-ALBUTEROL 20-100 MCG/ACT IN AERS
1.0000 | INHALATION_SPRAY | Freq: Two times a day (BID) | RESPIRATORY_TRACT | Status: DC
  Administered 2019-11-20 – 2019-11-21 (×2): 1 via RESPIRATORY_TRACT
  Filled 2019-11-20: qty 4

## 2019-11-20 MED ORDER — ENSURE ENLIVE PO LIQD: 237.0000 mL | Freq: Two times a day (BID) | ORAL | Status: DC

## 2019-11-20 MED ORDER — IPRATROPIUM-ALBUTEROL 20-100 MCG/ACT IN AERS
1.0000 | INHALATION_SPRAY | Freq: Four times a day (QID) | RESPIRATORY_TRACT | Status: DC
  Administered 2019-11-20: 1 via RESPIRATORY_TRACT
  Filled 2019-11-20: qty 4

## 2019-11-20 MED ORDER — LIP MEDEX EX OINT
TOPICAL_OINTMENT | CUTANEOUS | Status: AC
  Administered 2019-11-20: 03:00:00
  Filled 2019-11-20: qty 7

## 2019-11-20 MED ORDER — ALBUTEROL SULFATE HFA 108 (90 BASE) MCG/ACT IN AERS
1.0000 | INHALATION_SPRAY | RESPIRATORY_TRACT | Status: DC | PRN
  Administered 2019-11-21: 1 via RESPIRATORY_TRACT
  Filled 2019-11-20: qty 6.7

## 2019-11-20 NOTE — Progress Notes (Signed)
Update given to daughter Phineas Real.

## 2019-11-20 NOTE — Plan of Care (Signed)
Patient denies dyspnea on exertion or at rest.  Attempted to wean oxygen, drops to 88% on room air, back up to 90-95% on 2 liters.

## 2019-11-20 NOTE — Progress Notes (Signed)
PROGRESS NOTE    Brandon Ellison  EXH:[id number] DOB: 04/29/1951 DOA: 11/18/2019 PCP: Patient, No Pcp Per   Brief Narrative:  HPI per Dr. Flora Lipps on 11/19/2019 Brandon Ellison is a 68 y.o. male with past medical history of hypertension, hyperlipidemia, gout presented to the hospital with worsening shortness of breath for the last 3 days.  Patient states that his dyspnea was associated with cough but denied any fever, chills or rigor.  He denies any sore throat.  No nausea, vomiting or abdominal pain.  Patient denies any headache, fever or chills.  Patient denies any history of pulmonary issues or congestive heart failure.  No recent weight gain or peripheral edema.  Patient denies any chest pain, palpitation.  He stated that on Tuesday he felt lightheaded and fell and hit his head.  Patient denies any urinary urgency, frequency or dysuria.  Patient denies any syncope or loss of consciousness.  ED Course: In the ED, patient was noted to be hypoxic, febrile and tachypneic and was placed on nasal cannula oxygen.Marland Kitchen He was tachypneic at 30/min and oxygen saturation was 89 to 90% on rest..  Pulse ox improved to 95% on 2 L of nasal cannula.  Patient did have a mild thrombocytopenia as well.  Creatinine was elevated at 1.7.  Covid 19 PCR was sent from the ED which is pending.  Rapid Covid was negative.  In the ED, patient received Rocephin and Zithromax and Tylenol for multifocal pneumonia.  **Interim History COVID-19 testing has been negative x2 despite a high suspicion.  He was started on IV Decadron.  Will obtain a respiratory virus panel and influenza PCR screen.  We will continue supportive care and change nebs to inhalers.  Assessment & Plan:   Principal Problem:   Multifocal pneumonia Active Problems:   HTN (hypertension)   High cholesterol   Gout  Multifocal pneumonia with acute hypoxic respiratory failure and High Suspicion for COVID 19 Disease despite Negative tests   -Patient did have  dyspnea hypoxia on presentation and was subsequently febrile up to 100.5 F.  Covid point-of-care negative and Send Out test Negative.   -Continue supplemental oxygen.  SpO2: 91 % O2 Flow Rate (L/min): 2 L/min -Inflammatory Markers checked: Recent Labs    11/19/19 1020 11/20/19 0851  DDIMER 0.97* 1.24*  FERRITIN 1,057* 1,086*  LDH 283* 280*  CRP 13.3* 13.0*  -ESR was 77 Lab Results  Component Value Date   SARSCOV2NAA NOT DETECTED 11/19/2019  -Will check Respiratory Virus Panel and Influenza -PCT was <0.10 x2 so will Discontinue Abx -Unfortunately unable to give remdesivir due to patient not having a + COVID Test -WBC is within normal limits and trended down from 6.1-5.4 -High suspicion for viral infection -AST remains slightly elevated and went from 42 -> 54 -Continue dexamethasone 6 mg IV every 24 and will discontinue nebulized medications given concern for aerosolization of suspected Covid disease and will start the patient on albuterol inhaler and Combivent scheduled -We will add Robitussin-DM and as next 4 antitussives support and also add a flutter valve -Repeat chest x-ray today showed "The heart remains borderline enlarged. Vascular calcifications are seen in the aortic arch. Bibasilar interstitial and airspace opacities are not significantly changed since prior exam. There is no pleural effusion or pneumothorax." -Continue to monitor and trend inflammatory markers and follow-up on viral panel results and repeat chest x-ray in a.m.  Thrombocytopenia.   -Mild at 145,000 and improved to 166,000 Continue to monitor for signs and symptoms of bleeding;  currently no overt bleeding noted -Repeat CBC in a.m.  Hyperlipidemia.   -Continue atorvastatin 40 mg p.o. nightly and will need to continue to monitor closely given that his AST is slightly elevated.  Essential Hypertension.   -Continue Amlodipine 10 mg po Daily and Labetalol 200 mg po BID.   -Continue to monitor blood  pressures carefully.   -Hold losartan for now given AKI on ? CKD.  Acute Kidney Injury on ?CKD   -Continue with IV fluid hydration for now but will decreased IVF rate from 1100 mL/hr to 50 mL/hr and let it expire.   -Unknown baseline -Patient's BUN/creatinine went from 19/1.70 is now 19/1.49 -Avoid nephrotoxic medications, contrast dyes, hypotension as well as renally adjust medications if possible -Continue to monitor trend renal function repeat CMP in a.m.  History of Gout  -On Allopurinol 100 mg po Daily and will resume  Hyperglycemia -Patient's Blood Sugar on Admission was 112 and repeat this AM was 113 -Check HbA1c -Continue to [place] Blood Sugars carefully as patient is on Decadron and likely will have worsening hyperglycemia -If necessary will place on Sensitive Novolog SSI AC  DVT prophylaxis: Enoxaparin 40 mg sq q24h Code Status: FULL CODE Family Communication: No family present at bedside  Disposition Plan: Pending further improvement and weaning off of O2  Consultants:   None   Procedures:  None   Antimicrobials:  Anti-infectives (From admission, onward)   Start     Dose/Rate Route Frequency Ordered Stop   11/20/19 0400  azithromycin (ZITHROMAX) 500 mg in sodium chloride 0.9 % 250 mL IVPB     500 mg 250 mL/hr over 60 Minutes Intravenous Every 24 hours 11/19/19 2358     11/20/19 0300  cefTRIAXone (ROCEPHIN) 1 g in sodium chloride 0.9 % 100 mL IVPB     1 g 200 mL/hr over 30 Minutes Intravenous Every 24 hours 11/19/19 2358     11/19/19 0300  cefTRIAXone (ROCEPHIN) 1 g in sodium chloride 0.9 % 100 mL IVPB     1 g 200 mL/hr over 30 Minutes Intravenous  Once 11/19/19 0254 11/19/19 0332   11/19/19 0300  azithromycin (ZITHROMAX) 500 mg in sodium chloride 0.9 % 250 mL IVPB     500 mg 250 mL/hr over 60 Minutes Intravenous  Once 11/19/19 0254 11/19/19 0710     Subjective: Seen and examined at bedside and states that he is not short of breath and he feels a little  bit better today but his oxygen saturations remain on the lower end of the 90s.  No nausea or vomiting.  Denies any lightheadedness or dizziness.  Denies any current sick contacts.  No other concerns or complaints at this time.  Objective: Vitals:   11/19/19 2356 11/19/19 2357 11/20/19 0043 11/20/19 0411  BP: 132/83  140/81 133/70  Pulse: 77  74 67  Resp: 19   (!) 21  Temp: (!) 100.5 F (38.1 C)   98.9 F (37.2 C)  TempSrc: Oral   Oral  SpO2: 93%   91%  Weight:  76.1 kg    Height:  '6\' 2"'  (1.88 m)      Intake/Output Summary (Last 24 hours) at 11/20/2019 1306 Last data filed at 11/20/2019 1000 Gross per 24 hour  Intake 885.53 ml  Output 500 ml  Net 385.53 ml   Filed Weights   11/18/19 2241 11/19/19 2357  Weight: 84.8 kg 76.1 kg   Examination: Physical Exam:  Constitutional: Thin African-American male in NAD and appears calm  Eyes: Lids and conjunctivae normal, sclerae anicteric  ENMT: External Ears, Nose appear normal. Grossly normal hearing. Mucous membranes are moist.  Neck: Appears normal, supple, no cervical masses, normal ROM, no appreciable thyromegaly; no JVD Respiratory: Diminished to auscultation bilaterally with coarse breath sounds, no wheezing, rales, rhonchi or crackles. Normal respiratory effort and patient is not tachypenic. No accessory muscle use.  Unlabored breathing and he is wearing supplemental oxygen via nasal cannula 2 L Cardiovascular: RRR, no murmurs / rubs / gallops. S1 and S2 auscultated. No extremity edema.  Abdomen: Soft, non-tender, non-distended. Bowel sounds positive.  GU: Deferred. Musculoskeletal: No clubbing / cyanosis of digits/nails. No joint deformity upper and lower extremities.  Skin: No rashes, lesions, ulcers on limited skin evaluation. No induration; Warm and dry.  Neurologic: CN 2-12 grossly intact with no focal deficits. Romberg sign and cerebellar reflexes not assessed.  Psychiatric: Normal judgment and insight. Alert and oriented  x 3. Normal mood and appropriate affect.   Data Reviewed: I have personally reviewed following labs and imaging studies  CBC: Recent Labs  Lab 11/18/19 2309 11/20/19 0111  WBC 6.1 5.4  NEUTROABS 4.5  --   HGB 15.7 14.6  HCT 48.4 45.3  MCV 97.4 97.6  PLT 145* 035   Basic Metabolic Panel: Recent Labs  Lab 11/18/19 2309 11/20/19 0111  NA 136 136  K 3.9 3.8  CL 101 103  CO2 24 22  GLUCOSE 112* 113*  BUN 19 19  CREATININE 1.70* 1.49*  CALCIUM 8.3* 8.3*   GFR: Estimated Creatinine Clearance: 51.1 mL/min (A) (by C-G formula based on SCr of 1.49 mg/dL (H)). Liver Function Tests: Recent Labs  Lab 11/18/19 2309 11/20/19 0111  AST 42* 54*  ALT 30 40  ALKPHOS 52 57  BILITOT 0.8 0.6  PROT 7.2 6.9  ALBUMIN 3.4* 3.0*   Recent Labs  Lab 11/18/19 2309  LIPASE 41   No results for input(s): AMMONIA in the last 168 hours. Coagulation Profile: No results for input(s): INR, PROTIME in the last 168 hours. Cardiac Enzymes: No results for input(s): CKTOTAL, CKMB, CKMBINDEX, TROPONINI in the last 168 hours. BNP (last 3 results) No results for input(s): PROBNP in the last 8760 hours. HbA1C: No results for input(s): HGBA1C in the last 72 hours. CBG: No results for input(s): GLUCAP in the last 168 hours. Lipid Profile: No results for input(s): CHOL, HDL, LDLCALC, TRIG, CHOLHDL, LDLDIRECT in the last 72 hours. Thyroid Function Tests: No results for input(s): TSH, T4TOTAL, FREET4, T3FREE, THYROIDAB in the last 72 hours. Anemia Panel: Recent Labs    11/19/19 1020 11/20/19 0851  FERRITIN 1,057* 1,086*   Sepsis Labs: Recent Labs  Lab 11/19/19 1020 11/20/19 0851  PROCALCITON <0.10 <0.10    Recent Results (from the past 240 hour(s))  Novel Coronavirus, NAA (Hosp order, Send-out to Ref Lab; TAT 18-24 hrs     Status: None   Collection Time: 11/19/19 12:21 AM   Specimen: Nasopharyngeal Swab; Respiratory  Result Value Ref Range Status   SARS-CoV-2, NAA NOT DETECTED NOT  DETECTED Final    Comment: (NOTE) This nucleic acid amplification test was developed and its performance characteristics determined by [employer]. Nucleic acid amplification tests include PCR and TMA. This test has not been FDA cleared or approved. This test has been authorized by FDA under an Emergency Use Authorization (EUA). This test is only authorized for the duration of time the declaration that circumstances exist justifying the authorization of the emergency use of in vitro diagnostic  tests for detection of SARS-CoV-2 virus and/or diagnosis of COVID-19 infection under section 564(b)(1) of the Act, 21 U.S.C. 128SKS-1(N) (1), unless the authorization is terminated or revoked sooner. When diagnostic testing is negative, the possibility of a false negative result should be considered in the context of a patient's recent exposures and the presence of clinical signs and symptoms consistent with COVID-19. An individual without symptoms of COVID- 19 and who is not shedding SARS-CoV-2 vi rus would expect to have a negative (not detected) result in this assay. Performed At: [facility] [place], [place] [id number] Rush Farmer MD XV:[id number]    [place]  Final    Comment: Performed at [facility] [address], [place], [place] [address]    Radiology Studies: Dg Chest Port 1 View  Result Date: 11/20/2019 CLINICAL DATA:  Shortness of breath EXAM: PORTABLE CHEST 1 VIEW COMPARISON:  Chest radiograph dated 11/18/2019 FINDINGS: The heart remains borderline enlarged. Vascular calcifications are seen in the aortic arch. Bibasilar interstitial and airspace opacities are not significantly changed since prior exam. There is no pleural effusion or pneumothorax. IMPRESSION: 1. No significant interval change in bibasilar interstitial and airspace opacities. 2. Unchanged borderline cardiomegaly. Electronically Signed    By: Zerita Boers M.D.   On: 11/20/2019 12:15   Dg Chest Port 1 View  Result Date: 11/18/2019 CLINICAL DATA:  Cough, shortness of breath EXAM: PORTABLE CHEST 1 VIEW COMPARISON:  Radiograph Apr 29, 2016, CT August 19, 2015 FINDINGS: Basilar predominant interstitial and developing airspace opacity with some peripheral septal lines and central vascular congestion and cuffing. Cardiac silhouette is somewhat prominent though this may be accentuated by portable technique. The aorta is calcified and tortuous. No pneumothorax or effusion. No acute osseous or soft tissue abnormality. IMPRESSION: Findings suspicious for developing pulmonary edema. Early infection could have a similar appearance and should be excluded clinically. Aortic Atherosclerosis (ICD10-I70.0). Electronically Signed   By: Lovena Le M.D.   On: 11/18/2019 23:38    Scheduled Meds: . allopurinol  100 mg Oral Daily  . amLODipine  10 mg Oral Daily  . atorvastatin  40 mg Oral q1800  . dexamethasone (DECADRON) injection  6 mg Intravenous Q24H  . enoxaparin (LOVENOX) injection  40 mg Subcutaneous Q24H  . feeding supplement (ENSURE ENLIVE)  237 mL Oral BID BM  . ipratropium-albuterol  3 mL Inhalation Q6H  . labetalol  200 mg Oral BID  . mouth rinse  15 mL Mouth Rinse BID   Continuous Infusions: . sodium chloride 100 mL/hr at 11/20/19 1214  . azithromycin Stopped (11/20/19 0430)  . cefTRIAXone (ROCEPHIN)  IV Stopped (11/20/19 0244)    LOS: 1 day   Kerney Elbe, DO [facility] PAGER is on [place]  If 7PM-7AM, please contact night-coverage [email or web address]

## 2019-11-21 ENCOUNTER — Inpatient Hospital Stay (HOSPITAL_COMMUNITY): Payer: Medicare Other

## 2019-11-21 DIAGNOSIS — M1020 Drug-induced gout, unspecified site: Secondary | ICD-10-CM

## 2019-11-21 LAB — COMPREHENSIVE METABOLIC PANEL
ALT: 49 U/L — ABNORMAL HIGH (ref 0–44)
AST: 59 U/L — ABNORMAL HIGH (ref 15–41)
Albumin: 2.8 g/dL — ABNORMAL LOW (ref 3.5–5.0)
Alkaline Phosphatase: 64 U/L (ref 38–126)
Anion gap: 9 (ref 5–15)
BUN: 15 mg/dL (ref 8–23)
CO2: 21 mmol/L — ABNORMAL LOW (ref 22–32)
Calcium: 8.4 mg/dL — ABNORMAL LOW (ref 8.9–10.3)
Chloride: 110 mmol/L (ref 98–111)
Creatinine, Ser: 1.18 mg/dL (ref 0.61–1.24)
GFR calc Af Amer: >60 mL/min (ref >60)
GFR calc non Af Amer: >60 mL/min (ref >60)
Glucose, Bld: 141 mg/dL — ABNORMAL HIGH (ref 70–99)
Potassium: 4.3 mmol/L (ref 3.5–5.1)
Sodium: 140 mmol/L (ref 135–145)
Total Bilirubin: 0.6 mg/dL (ref 0.3–1.2)
Total Protein: 6.6 g/dL (ref 6.5–8.1)

## 2019-11-21 LAB — CBC WITH DIFFERENTIAL/PLATELET
Abs Immature Granulocytes: 0.02 10*3/uL (ref 0.00–0.07)
Basophils Absolute: 0 10*3/uL (ref 0.0–0.1)
Basophils Relative: 0 %
Eosinophils Absolute: 0 10*3/uL (ref 0.0–0.5)
Eosinophils Relative: 0 %
HCT: 45.7 % (ref 39.0–52.0)
Hemoglobin: 14.8 g/dL (ref 13.0–17.0)
Immature Granulocytes: 1 %
Lymphocytes Relative: 21 %
Lymphs Abs: 0.7 10*3/uL (ref 0.7–4.0)
MCH: 31.7 pg (ref 26.0–34.0)
MCHC: 32.4 g/dL (ref 30.0–36.0)
MCV: 97.9 fL (ref 80.0–100.0)
Monocytes Absolute: 0.1 10*3/uL (ref 0.1–1.0)
Monocytes Relative: 3 %
Neutro Abs: 2.5 10*3/uL (ref 1.7–7.7)
Neutrophils Relative %: 75 %
Platelets: 217 10*3/uL (ref 150–400)
RBC: 4.67 MIL/uL (ref 4.22–5.81)
RDW: 12.5 % (ref 11.5–15.5)
WBC: 3.4 10*3/uL — ABNORMAL LOW (ref 4.0–10.5)
nRBC: 0 % (ref 0.0–0.2)

## 2019-11-21 LAB — STREP PNEUMONIAE URINARY ANTIGEN: Strep Pneumo Urinary Antigen: NEGATIVE

## 2019-11-21 LAB — MAGNESIUM: Magnesium: 2.5 mg/dL — ABNORMAL HIGH (ref 1.7–2.4)

## 2019-11-21 LAB — FIBRINOGEN: Fibrinogen: >800 mg/dL — ABNORMAL HIGH (ref 210–475)

## 2019-11-21 LAB — PROCALCITONIN: Procalcitonin: <0.1 ng/mL

## 2019-11-21 LAB — C-REACTIVE PROTEIN: CRP: 9.9 mg/dL — ABNORMAL HIGH (ref <1.0)

## 2019-11-21 LAB — D-DIMER, QUANTITATIVE: D-Dimer, Quant: 1.18 ug/mL-FEU — ABNORMAL HIGH (ref 0.00–0.50)

## 2019-11-21 LAB — PHOSPHORUS: Phosphorus: 2.4 mg/dL — ABNORMAL LOW (ref 2.5–4.6)

## 2019-11-21 LAB — LACTATE DEHYDROGENASE: LDH: 316 U/L — ABNORMAL HIGH (ref 98–192)

## 2019-11-21 LAB — SEDIMENTATION RATE: Sed Rate: 41 mm/hr — ABNORMAL HIGH (ref 0–16)

## 2019-11-21 LAB — FERRITIN: Ferritin: 1164 ng/mL — ABNORMAL HIGH (ref 24–336)

## 2019-11-21 LAB — LEGIONELLA PNEUMOPHILA SEROGP 1 UR AG: L. pneumophila Serogp 1 Ur Ag: NEGATIVE

## 2019-11-21 MED ORDER — SODIUM CHLORIDE 0.9 % IV SOLN
INTRAVENOUS | Status: DC
  Administered 2019-11-21: 08:00:00 via INTRAVENOUS

## 2019-11-21 MED ORDER — SODIUM CHLORIDE 0.9 % IV SOLN: INTRAVENOUS | Status: DC

## 2019-11-21 NOTE — Progress Notes (Signed)
Patient insisted on leaving hospital, against MD advice. MD reported this to RN.  RN explained the importance of staying for treatment, but patient politely insisted he wanted to go home.  RN witnessed patient sign AMA paperwork. RN removed IV. Patient was in possession of all personal belongings. RN & NT assisted patient out of the hospital. RN waited with patient until his son arrived to transport him home.

## 2019-11-21 NOTE — Discharge Summary (Signed)
Physician Discharge Summary  Brandon Ellison AVW:[id number] DOB: 08/08/1951 DOA: 11/18/2019  PCP: Patient, No Pcp Per  Admit date: 11/18/2019 Discharge date: 11/21/2019  Admitted From: Home Disposition: Left Against Medical Advice   Recommendations for Outpatient Follow-up:  1. Follow up with PCP in 1-2 weeks 2. Please obtain CMP/CBC, Mag, Phos in one week 3. Self isolate for at least 14 days 4. If symptoms worsen or progress recommend going back to the emergency room  Home Health: No Equipment/Devices: None  Discharge Condition: Guarded  CODE STATUS: FULL CODE  Diet recommendation: Heart Healthy Diet   Brief/Interim Summary: HPI per Dr. Flora Lipps on 11/19/2019 Brandon Ellison a 68 y.o.malewith past medical history of hypertension, hyperlipidemia, gout presented to the hospital with worsening shortness of breath for the last 3 days. Patient states that his dyspnea wasassociated with cough but denied any fever, chills or rigor. He denies any sore throat. No nausea,vomiting or abdominal pain. Patient denies any headache, fever or chills. Patient denies any history of pulmonary issues or congestive heart failure. No recent weight gain or peripheral edema. Patient denies any chest pain,palpitation.He stated that on Tuesday he felt lightheaded and fell and hit his head.Patient denies any urinary urgency, frequency or dysuria. Patient denies any syncope or loss of consciousness.  ED Course:In the ED, patient was noted to be hypoxic,febrile and tachypneic and was placed on nasal cannula oxygen.Marland Kitchen He was tachypneic at 30/min and oxygen saturation was 89 to 90% on rest.. Pulse ox improved to 95% on 2 L of nasal cannula. Patient did have a mild thrombocytopenia as well. Creatinine was elevated at 1.7. Covid 19 PCR was sent from the ED which is pending. Rapid Covid was negative.In the ED,patient received Rocephin and Zithromax and Tylenolfor multifocal  pneumonia.  **Interim History COVID-19 testing has been negative x2 despite a high suspicion.  He was started on IV Decadron.  Will obtain a respiratory virus panel and influenza PCR screen and these were negative.  Continued supportive care and change nebs to inhalers.  Attempted to wean the patient to Room Air yesterday but he desaturated yesterday and had to be placed back on 2 L.  This morning he was still on 2 L and some of his inflammatory markers are trending up and somewhat trending down.  Patient felt well and wanted to go home.  I felt that he is not medically ready to go yet however he insisted on and had a very lengthy discussion with him about the risks of leaving Brandon Ellison including but not limited to worsening, decompensation and even possible death.  Patient understands the small risks and sound mind and signed out [name] and left the hospital  Discharge Diagnoses:  Principal Problem:   Multifocal pneumonia Active Problems:   HTN (hypertension)   High cholesterol   Gout  Multifocal pneumonia with acute hypoxic respiratory failure and High Suspicion for COVID 19 Disease despite Negative tests  -Patient did have dyspnea hypoxia on presentation and was subsequently febrile up to 100.5 F. Covidpoint-of-carenegative and Send Out test Negative.  -Continue supplemental oxygen. SpO2: 91 % O2 Flow Rate (L/min): 2 L/min -Inflammatory Markers checked: Recent Labs    11/19/19 1020 11/20/19 0851 11/21/19 0346  DDIMER 0.97* 1.24* 1.18*  FERRITIN 1,057* 1,086* 1,164*  LDH 283* 280* 316*  CRP 13.3* 13.0* 9.9*  -ESR Was 77 and trended down to 41 -Fibrinogen went from 793 -> 746 -> >800  Lab Results  Component Value Date   SARSCOV2NAA  NOT DETECTED 11/19/2019  -Was going to Repeat a COVID19 testing today but patient refused  -Checked Respiratory Virus Panel and Influenza via PCR and was Negative  -PCT was <0.10 x2 so will Discontinue  Abx -Unfortunately unable to give Remdesivir due to patient not having a + COVID Test -WBC is within normal limits and trended down from 6.1-5.4 -High suspicion for viral infection -AST remains slightly elevated and went from 42 -> 54 and is tending up further to 59 -Continue dexamethasone 6 mg IV every 24 and will discontinue nebulized medications given concern for aerosolization of suspected Covid disease and will start the patient on albuterol inhaler and Combivent scheduled -We will add Robitussin-DM and as next 4 antitussives support and also add a flutter valve -Repeat chest x-ray today showed "Unchanged appearance of the bilateral interstitial and airspace opacities. Stable mild cardiomegaly." -Wanted Continue to monitor and trend inflammatory markers and follow-up on viral panel results and repeat chest x-ray in a.m patient signed out AGAINST MEDICAL ADVICE  Thrombocytopenia.  -Mild at 145,000 and improved to 217,000 -Continue to monitor for signs and symptoms of bleeding; currently no overt bleeding noted -Signed out AMA  Hyperlipidemia.  -Continue atorvastatin 40 mg p.o. nightly and will need to continue to monitor closely given that his AST is slightly elevated -LFTs further elevated and AST is now 59 and ALT was 49 this morning and was going to evaluate for his abnormalities but the patient signed out AMA.  Essential Hypertension.  -Continue Amlodipine 10 mg po Daily and Labetalol 200 mg po BID.  -Continue to monitor blood pressures carefully.  -Hold losartan for now given AKI on ? CKD. -Last blood pressure was 146/87  Acute Kidney Injury on ?CKD  Metabolic Acidosis -Continue with IV fluid hydration for now but will decreased IVF rate from 100 mL/hr to 50 mL/hr and let it expire yesterday.  Renewed IV fluid hydration at 50 MLS per hour for patient left AGAINST MEDICAL ADVICE -Unknown baseline -Patient's BUN/creatinine went from 19/1.70 is now 15/1.18 -Patient CO2  was 21, anion gap was 9, chloride level was 110 -Avoid nephrotoxic medications, contrast dyes, hypotension as well as renally adjust medications if possible -Wanted to Continue to monitor trend renal function but patient signed out AMA  History of Gout  -On Allopurinol 100 mg po Daily and will resume  Hyperglycemia -Patient's Blood Sugar on Admission was 112 and repeat this AM was 113 -Checked HbA1c was 6.0 -Continue to [place] Blood Sugars carefully as patient is on Decadron and likely will have worsening hyperglycemia -If necessary will place on Sensitive Novolog SSI AC  Abnormal LFTs -Patient's AST and ALT were now elevated as ALT was 59 and ALT was 49 -Monitor pain and acute hepatitis panel and right upper quadrant ultrasound but patient signed out AGAINST MEDICAL ADVICE  Leukopenia -Patient's WBC went from 5.4 is now 3.4 -In the setting of viral illness and high suspicion for COVID-19 disease -Plan to repeat blood work in a.m. but patient signed out AGAINST MEDICAL ADVICE  Hypophosphatemia -Patient's Phos Level was 2.4 -Was to replete with po K PHOS Neutral 500 mg po x1 -Left AMA prior to doing so   Discharge Instructions  Allergies as of 11/21/2019   No Known Allergies     Medication List    TAKE these medications   allopurinol 100 MG tablet Commonly known as: ZYLOPRIM Take 100 mg by mouth daily.   amLODipine 10 MG tablet Commonly known as: NORVASC Take 10 mg by  mouth daily.   atorvastatin 40 MG tablet Commonly known as: LIPITOR Take 40 mg by mouth daily.   labetalol 200 MG tablet Commonly known as: NORMODYNE Take 200 mg by mouth 2 (two) times daily.   losartan 50 MG tablet Commonly known as: COZAAR Take 50 mg by mouth daily.       No Known Allergies  Consultations:  None  Procedures/Studies: Dg Chest Port 1 View  Result Date: 11/21/2019 CLINICAL DATA:  Breath shortness of breath EXAM: PORTABLE CHEST 1 VIEW COMPARISON:  Radiograph  11/20/2019, CT 08/19/2015 FINDINGS: Stable appearance of the bilateral interstitial and airspace opacities. No pneumothorax or effusion. Cardiomediastinal contours are unchanged from prior. No acute osseous or soft tissue abnormality. IMPRESSION: Unchanged appearance of the bilateral interstitial and airspace opacities. Stable mild cardiomegaly. Electronically Signed   By: Lovena Le M.D.   On: 11/21/2019 06:03   Dg Chest Port 1 View  Result Date: 11/20/2019 CLINICAL DATA:  Shortness of breath EXAM: PORTABLE CHEST 1 VIEW COMPARISON:  Chest radiograph dated 11/18/2019 FINDINGS: The heart remains borderline enlarged. Vascular calcifications are seen in the aortic arch. Bibasilar interstitial and airspace opacities are not significantly changed since prior exam. There is no pleural effusion or pneumothorax. IMPRESSION: 1. No significant interval change in bibasilar interstitial and airspace opacities. 2. Unchanged borderline cardiomegaly. Electronically Signed   By: Zerita Boers M.D.   On: 11/20/2019 12:15   Dg Chest Port 1 View  Result Date: 11/18/2019 CLINICAL DATA:  Cough, shortness of breath EXAM: PORTABLE CHEST 1 VIEW COMPARISON:  Radiograph Apr 29, 2016, CT August 19, 2015 FINDINGS: Basilar predominant interstitial and developing airspace opacity with some peripheral septal lines and central vascular congestion and cuffing. Cardiac silhouette is somewhat prominent though this may be accentuated by portable technique. The aorta is calcified and tortuous. No pneumothorax or effusion. No acute osseous or soft tissue abnormality. IMPRESSION: Findings suspicious for developing pulmonary edema. Early infection could have a similar appearance and should be excluded clinically. Aortic Atherosclerosis (ICD10-I70.0). Electronically Signed   By: Lovena Le M.D.   On: 11/18/2019 23:38   US Abdomen Limited Ruq  Result Date: 11/21/2019 CLINICAL DATA:  68 year old male with abnormal LFTs. EXAM: ULTRASOUND  ABDOMEN LIMITED RIGHT UPPER QUADRANT COMPARISON:  Chest CT dated 08/19/2015. FINDINGS: Gallbladder: There is a 3 mm echogenic focus without posterior shadowing within the gallbladder which may represent a small polyp. No shadowing stone. There is no gallbladder wall thickening or pericholecystic fluid. Negative sonographic Murphy's sign. Common bile duct: Diameter: 5 mm Liver: There is diffuse increased liver echogenicity most commonly seen in the setting of fatty infiltration. Superimposed inflammation or fibrosis is not excluded. Clinical correlation is recommended. There is a 5.0 x 4.6 x 5.2 cm cyst in the liver. A 4.7 x 4.0 x 2.8 cm heterogeneous hypoechoic area in the right lobe of the liver most consistent with an area of fatty spurring. A 1.8 x 1.3 cm rounded echogenic area within this hypoechoic region may represent fatty liver parenchyma although a mass is not entirely excluded. This area of heterogeneity appears to have been present on the chest CT of 08/19/2015. Short-term follow-up with ultrasound in 3 months or further evaluation with MRI without and with contrast is recommended. Portal vein is patent on color Doppler imaging with normal direction of blood flow towards the liver. Other: There is a small subhepatic ascites. IMPRESSION: 1. Fatty liver with an area of fat sparing in the right lobe of the liver (details above). Underlying  mass is not excluded. Short-term follow-up with ultrasound in 3 months or further evaluation with MRI without and with contrast is recommended. 2. A 3 mm gallbladder polyp. 3. Patent main portal vein with hepatopetal flow. Electronically Signed   By: Anner Crete M.D.   On: 11/21/2019 10:00    Subjective: Seen and examined this morning he states that he wanted to leave.  I told him that his inflammatory markers are still elevated and that he is still on oxygen but he felt that he was better and stated that he no longer needed it and probably remove the oxygen.  I  discussed the risks of leaving hospital [place] and patient understands the risks and was of sound mind and decided that he still wanted to leave.  He signed the [place] papers and was wheeled out of the hospital and picked up by son.  Discharge Exam: Vitals:   11/20/19 2107 11/21/19 0434  BP: 139/80 (!) 146/87  Pulse: 66 68  Resp: 20 20  Temp: 98.7 F (37.1 C) 98.7 F (37.1 C)  SpO2: 91% 92%   Vitals:   11/20/19 1314 11/20/19 1805 11/20/19 2107 11/21/19 0434  BP:  131/85 139/80 (!) 146/87  Pulse:  70 66 68  Resp:  '18 20 20  ' Temp:  98.2 F (36.8 C) 98.7 F (37.1 C) 98.7 F (37.1 C)  TempSrc:  Oral Oral Oral  SpO2: 91% 90% 91% 92%  Weight:    77.5 kg  Height:       General: Pt is alert, awake, not in acute distress Cardiovascular: RRR, S1/S2 +, no rubs, no gallops Respiratory: Diminished bilaterally, no wheezing, no rhonchi with coarse breath sounds. Still on Supplemental O2 via [place] Abdominal: Soft, NT, ND, bowel sounds + Extremities: no edema, no cyanosis  The results of significant diagnostics from this hospitalization (including imaging, microbiology, ancillary and laboratory) are listed below for reference.    Microbiology: Recent Results (from the past 240 hour(s))  Novel Coronavirus, NAA (Hosp order, Send-out to Ref Lab; TAT 18-24 hrs     Status: None   Collection Time: 11/19/19 12:21 AM   Specimen: Nasopharyngeal Swab; Respiratory  Result Value Ref Range Status   SARS-CoV-2, NAA NOT DETECTED NOT DETECTED Final    Comment: (NOTE) This nucleic acid amplification test was developed and its performance characteristics determined by [employer]. Nucleic acid amplification tests include PCR and TMA. This test has not been FDA cleared or approved. This test has been authorized by FDA under an Emergency Use Authorization (EUA). This test is only authorized for the duration of time the declaration that circumstances exist justifying the authorization  of the emergency use of in vitro diagnostic tests for detection of SARS-CoV-2 virus and/or diagnosis of COVID-19 infection under section 564(b)(1) of the Act, 21 U.S.C. 976BHA-1(P) (1), unless the authorization is terminated or revoked sooner. When diagnostic testing is negative, the possibility of a false negative result should be considered in the context of a patient's recent exposures and the presence of clinical signs and symptoms consistent with COVID-19. An individual without symptoms of COVID- 19 and who is not shedding SARS-CoV-2 vi rus would expect to have a negative (not detected) result in this assay. Performed At: [facility] [place], [place] [id number] Rush Farmer MD DZ:[id number]    [place]  Final    Comment: Performed at [facility] [address], [place], [place] [address]  Respiratory Panel by PCR  Status: None   Collection Time: 11/20/19  1:15 PM   Specimen: Nasopharyngeal Swab; Respiratory  Result Value Ref Range Status   Adenovirus NOT DETECTED NOT DETECTED Final   Coronavirus 229E NOT DETECTED NOT DETECTED Final    Comment: (NOTE) The Coronavirus on the Respiratory Panel, DOES NOT test for the novel  Coronavirus (2019 nCoV)    Coronavirus HKU1 NOT DETECTED NOT DETECTED Final   Coronavirus NL63 NOT DETECTED NOT DETECTED Final   Coronavirus OC43 NOT DETECTED NOT DETECTED Final   Metapneumovirus NOT DETECTED NOT DETECTED Final   Rhinovirus / Enterovirus NOT DETECTED NOT DETECTED Final   Influenza A NOT DETECTED NOT DETECTED Final   Influenza B NOT DETECTED NOT DETECTED Final   Parainfluenza Virus 1 NOT DETECTED NOT DETECTED Final   Parainfluenza Virus 2 NOT DETECTED NOT DETECTED Final   Parainfluenza Virus 3 NOT DETECTED NOT DETECTED Final   Parainfluenza Virus 4 NOT DETECTED NOT DETECTED Final   Respiratory Syncytial Virus NOT DETECTED NOT DETECTED Final   Bordetella pertussis NOT  DETECTED NOT DETECTED Final   Chlamydophila pneumoniae NOT DETECTED NOT DETECTED Final   Mycoplasma pneumoniae NOT DETECTED NOT DETECTED Final    Comment: Performed at [facility], [place]. [address] [place], [place] [address]    Labs: BNP (last 3 results) Recent Labs    11/18/19 2309  BNP 38.1   Basic Metabolic Panel: Recent Labs  Lab 11/18/19 2309 11/20/19 0111 11/21/19 0346  NA 136 136 140  K 3.9 3.8 4.3  CL 101 103 110  CO2 24 22 21*  GLUCOSE 112* 113* 141*  BUN '19 19 15  ' CREATININE 1.70* 1.49* 1.18  CALCIUM 8.3* 8.3* 8.4*  MG  --   --  2.5*  PHOS  --   --  2.4*   Liver Function Tests: Recent Labs  Lab 11/18/19 2309 11/20/19 0111 11/21/19 0346  AST 42* 54* 59*  ALT 30 40 49*  ALKPHOS 52 57 64  BILITOT 0.8 0.6 0.6  PROT 7.2 6.9 6.6  ALBUMIN 3.4* 3.0* 2.8*   Recent Labs  Lab 11/18/19 2309  LIPASE 41   No results for input(s): AMMONIA in the last 168 hours. CBC: Recent Labs  Lab 11/18/19 2309 11/20/19 0111 11/21/19 0346  WBC 6.1 5.4 3.4*  NEUTROABS 4.5  --  2.5  HGB 15.7 14.6 14.8  HCT 48.4 45.3 45.7  MCV 97.4 97.6 97.9  PLT 145* 166 217   Cardiac Enzymes: No results for input(s): CKTOTAL, CKMB, CKMBINDEX, TROPONINI in the last 168 hours. BNP: Invalid input(s): POCBNP CBG: No results for input(s): GLUCAP in the last 168 hours. D-Dimer Recent Labs    11/20/19 0851 11/21/19 0346  DDIMER 1.24* 1.18*   Hgb A1c Recent Labs    11/20/19 1407  HGBA1C 6.0*   Lipid Profile No results for input(s): CHOL, HDL, LDLCALC, TRIG, CHOLHDL, LDLDIRECT in the last 72 hours. Thyroid function studies No results for input(s): TSH, T4TOTAL, T3FREE, THYROIDAB in the last 72 hours.  Invalid input(s): FREET3 Anemia work up [employer]    11/20/19 0851 11/21/19 0346  FERRITIN 1,086* 1,164*   Urinalysis No results found for: COLORURINE, APPEARANCEUR, LABSPEC, [place], GLUCOSEU, HGBUR, BILIRUBINUR, KETONESUR, PROTEINUR, UROBILINOGEN, NITRITE,  LEUKOCYTESUR Sepsis Labs Invalid input(s): PROCALCITONIN,  WBC,  LACTICIDVEN Microbiology Recent Results (from the past 240 hour(s))  Novel Coronavirus, NAA (Hosp order, Send-out to Ref Lab; TAT 18-24 hrs     Status: None   Collection Time: 11/19/19 12:21 AM  Specimen: Nasopharyngeal Swab; Respiratory  Result Value Ref Range Status   SARS-CoV-2, NAA NOT DETECTED NOT DETECTED Final    Comment: (NOTE) This nucleic acid amplification test was developed and its performance characteristics determined by [employer]. Nucleic acid amplification tests include PCR and TMA. This test has not been FDA cleared or approved. This test has been authorized by FDA under an Emergency Use Authorization (EUA). This test is only authorized for the duration of time the declaration that circumstances exist justifying the authorization of the emergency use of in vitro diagnostic tests for detection of SARS-CoV-2 virus and/or diagnosis of COVID-19 infection under section 564(b)(1) of the Act, 21 U.S.C. 242AST-4(H) (1), unless the authorization is terminated or revoked sooner. When diagnostic testing is negative, the possibility of a false negative result should be considered in the context of a patient's recent exposures and the presence of clinical signs and symptoms consistent with COVID-19. An individual without symptoms of COVID- 19 and who is not shedding SARS-CoV-2 vi rus would expect to have a negative (not detected) result in this assay. Performed At: [facility] [place], [place] [id number] Rush Farmer MD XQ:[id number]    [place]  Final    Comment: Performed at [facility] [address], [place], [place] [address]  Respiratory Panel by PCR     Status: None   Collection Time: 11/20/19  1:15 PM   Specimen: Nasopharyngeal Swab; Respiratory  Result Value Ref Range Status   Adenovirus NOT DETECTED NOT DETECTED Final    Coronavirus 229E NOT DETECTED NOT DETECTED Final    Comment: (NOTE) The Coronavirus on the Respiratory Panel, DOES NOT test for the novel  Coronavirus (2019 nCoV)    Coronavirus HKU1 NOT DETECTED NOT DETECTED Final   Coronavirus NL63 NOT DETECTED NOT DETECTED Final   Coronavirus OC43 NOT DETECTED NOT DETECTED Final   Metapneumovirus NOT DETECTED NOT DETECTED Final   Rhinovirus / Enterovirus NOT DETECTED NOT DETECTED Final   Influenza A NOT DETECTED NOT DETECTED Final   Influenza B NOT DETECTED NOT DETECTED Final   Parainfluenza Virus 1 NOT DETECTED NOT DETECTED Final   Parainfluenza Virus 2 NOT DETECTED NOT DETECTED Final   Parainfluenza Virus 3 NOT DETECTED NOT DETECTED Final   Parainfluenza Virus 4 NOT DETECTED NOT DETECTED Final   Respiratory Syncytial Virus NOT DETECTED NOT DETECTED Final   Bordetella pertussis NOT DETECTED NOT DETECTED Final   Chlamydophila pneumoniae NOT DETECTED NOT DETECTED Final   Mycoplasma pneumoniae NOT DETECTED NOT DETECTED Final    Comment: Performed at [facility], [place]. [address], [place], [place] [address]   Time coordinating discharge: 25 minutes  SIGNED:  Kerney Elbe, DO [facility] 11/21/2019, 12:33 PM Pager is on [phone]  If 7PM-7AM, please contact night-coverage [email or web address] Password [email or web address]

## 2021-05-30 IMAGING — DX DG CHEST 1V PORT
1 series · 1 of 1 positions shown · non-contrast
Comparison: Radiograph 11/20/2019, CT 08/19/2015

CLINICAL DATA: Breath shortness of breath

EXAM:
PORTABLE CHEST 1 VIEW

[chest ap]
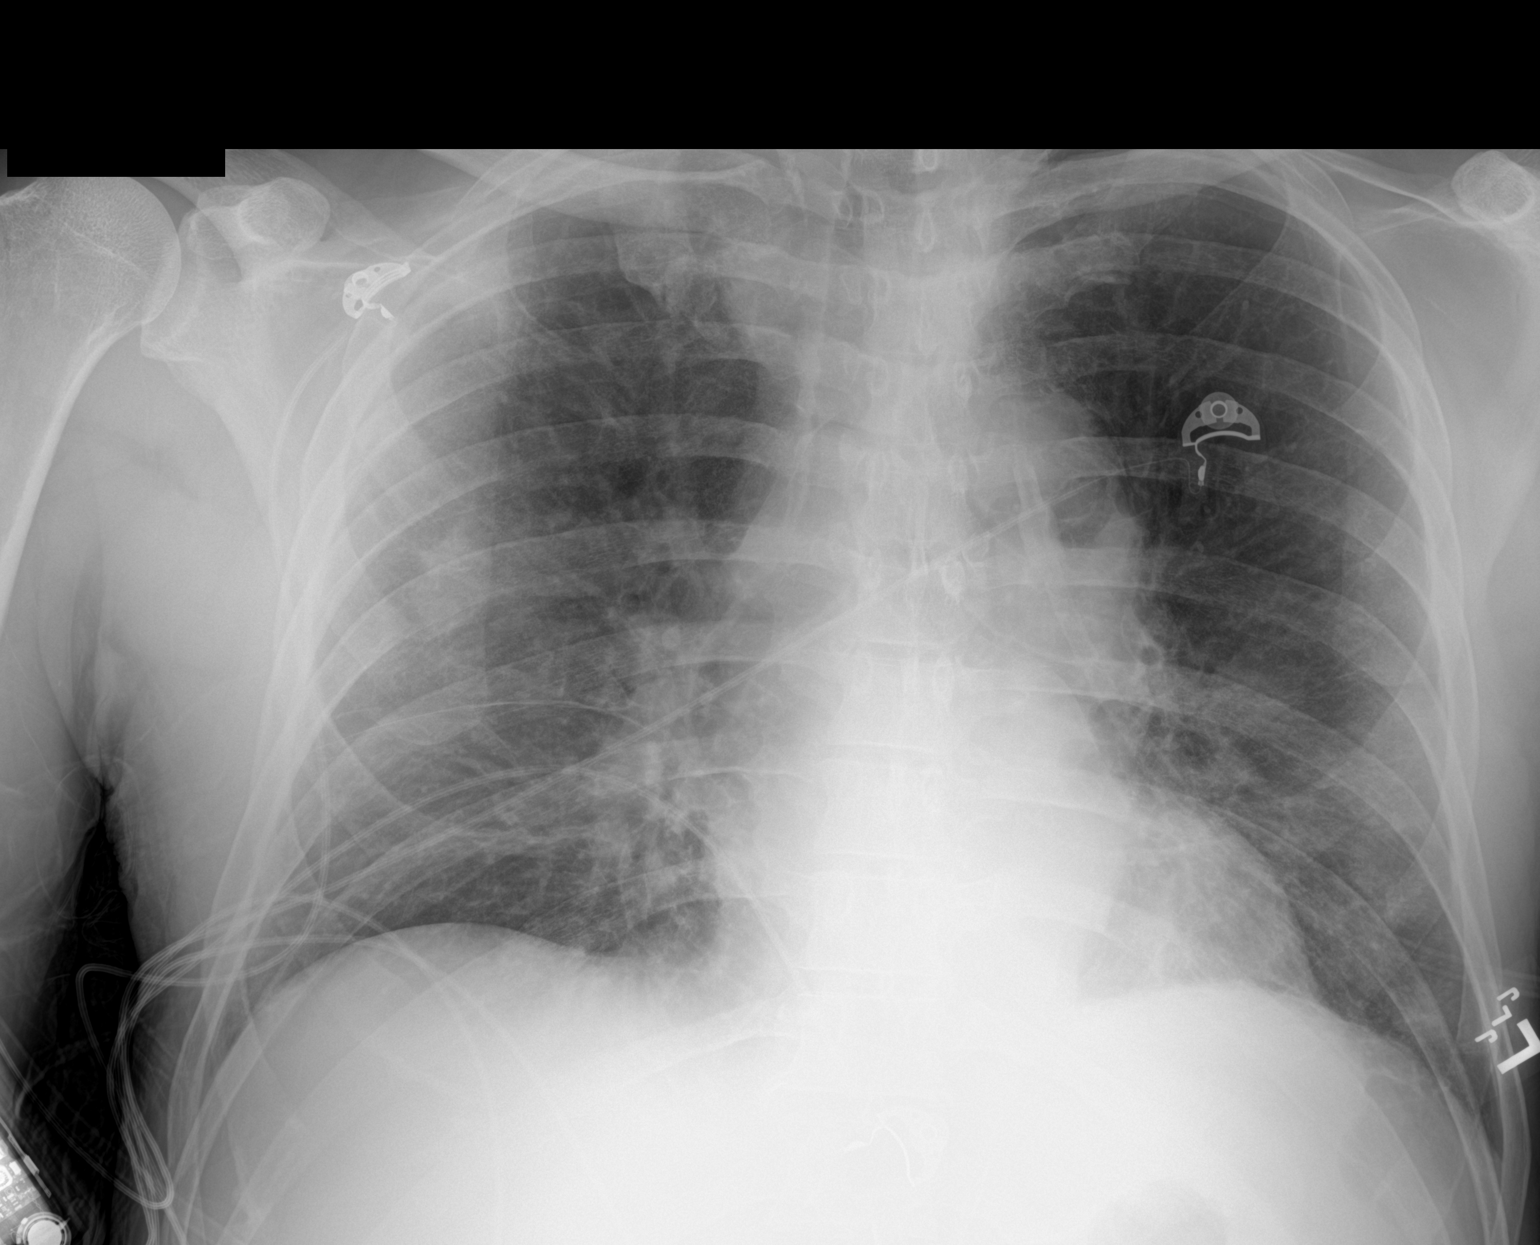

[1 of 1 positions shown; findings below may reference images not displayed]

FINDINGS: Stable appearance of the bilateral interstitial and airspace
opacities. No pneumothorax or effusion. Cardiomediastinal contours
are unchanged from prior. No acute osseous or soft tissue
abnormality.
IMPRESSION: Unchanged appearance of the bilateral interstitial and airspace
opacities.

Stable mild cardiomegaly.

## 2021-05-30 IMAGING — US US ABDOMEN LIMITED
1 series · 13 of 25 positions shown · non-contrast
Comparison: Chest CT dated 08/19/2015.

CLINICAL DATA: 68-year-old male with abnormal LFTs.

EXAM:
ULTRASOUND ABDOMEN LIMITED RIGHT UPPER QUADRANT

[Series 1: us abdomen limited · 13 of 99 slices shown]
[im 1/99]
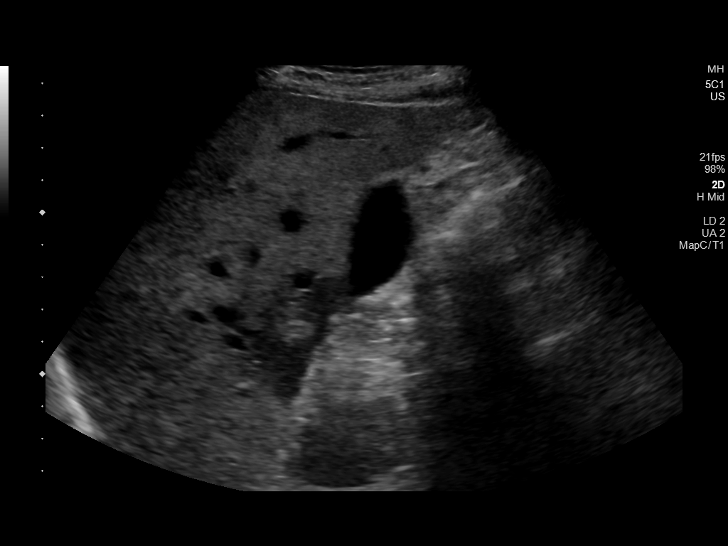
[im 9/99]
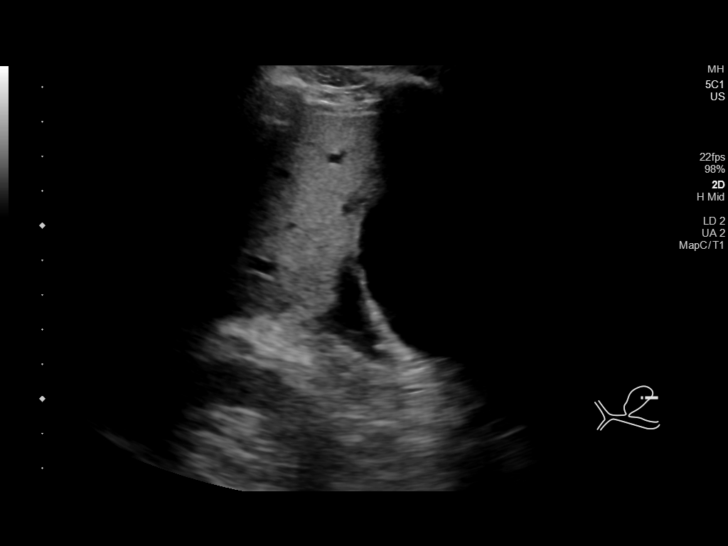
[im 17/99]
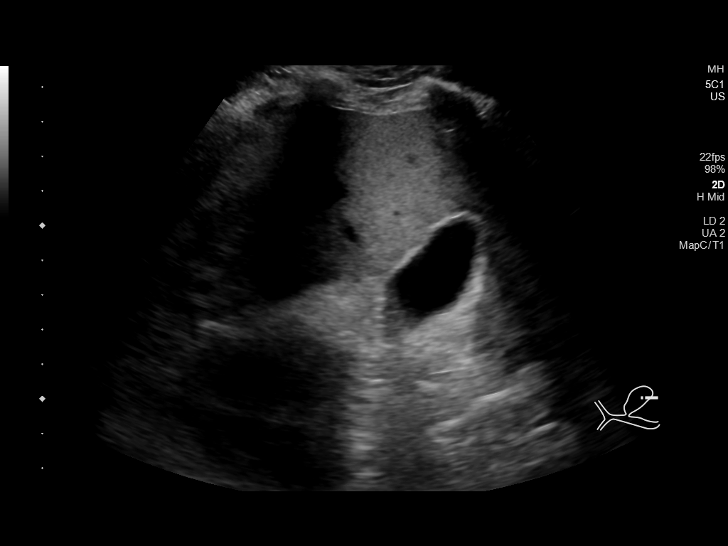
[im 25/99]
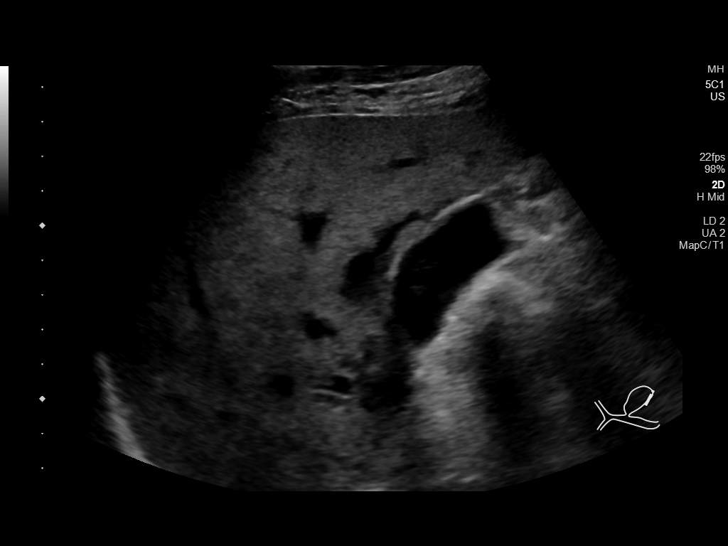
[im 33/99]
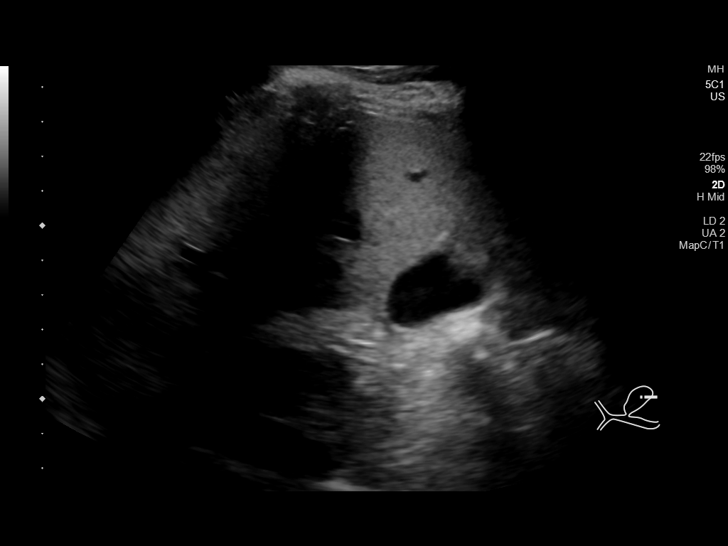
[im 41/99]
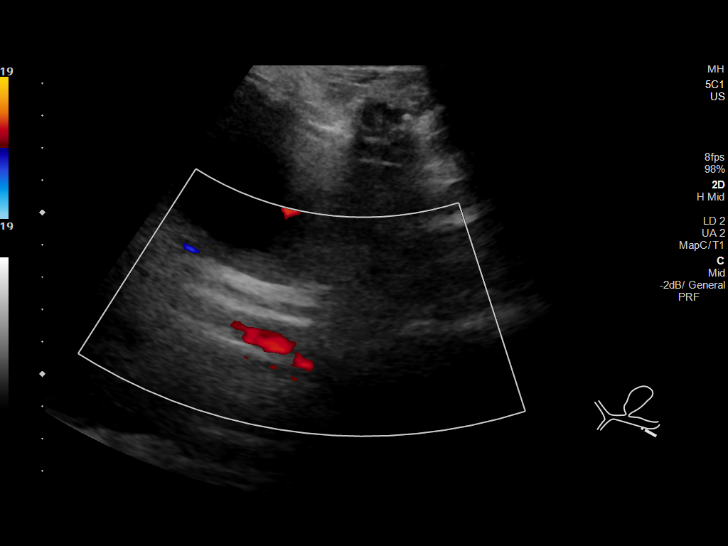
[im 50/99]
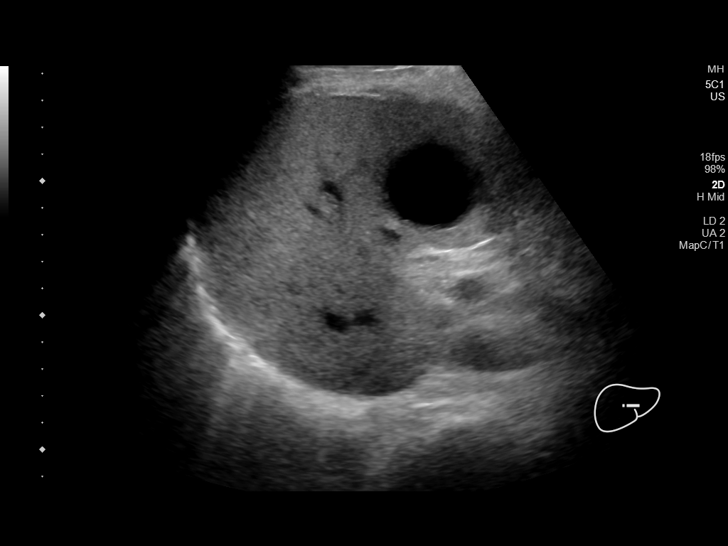
[im 58/99]
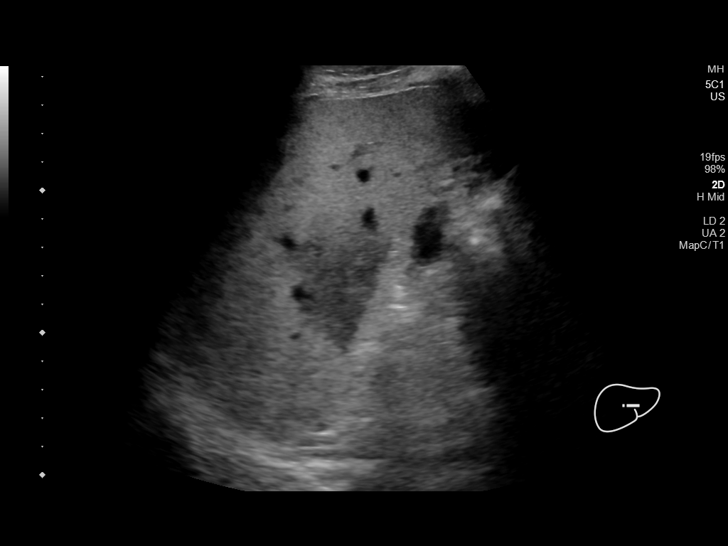
[im 66/99]
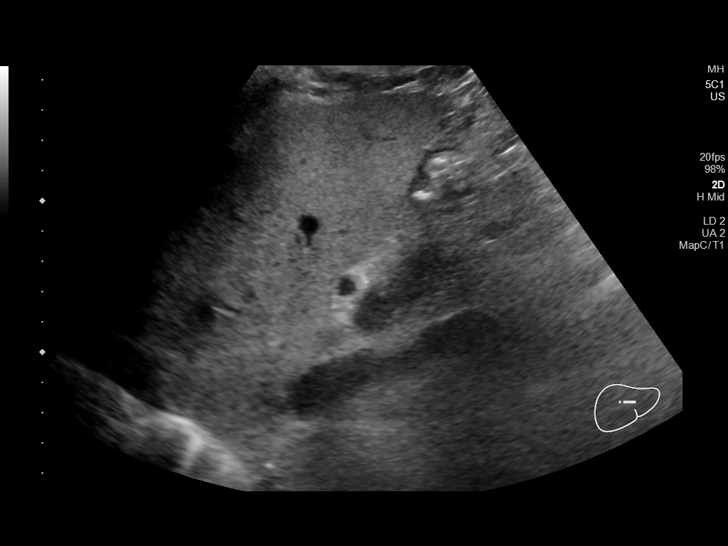
[im 74/99]
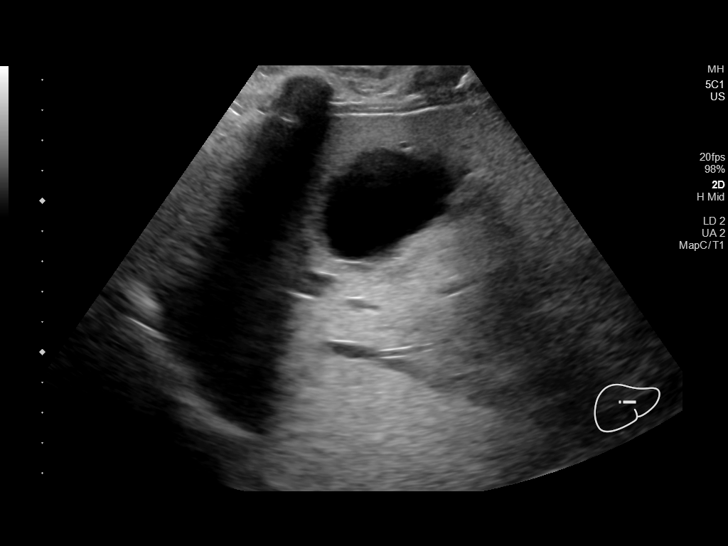
[im 82/99]
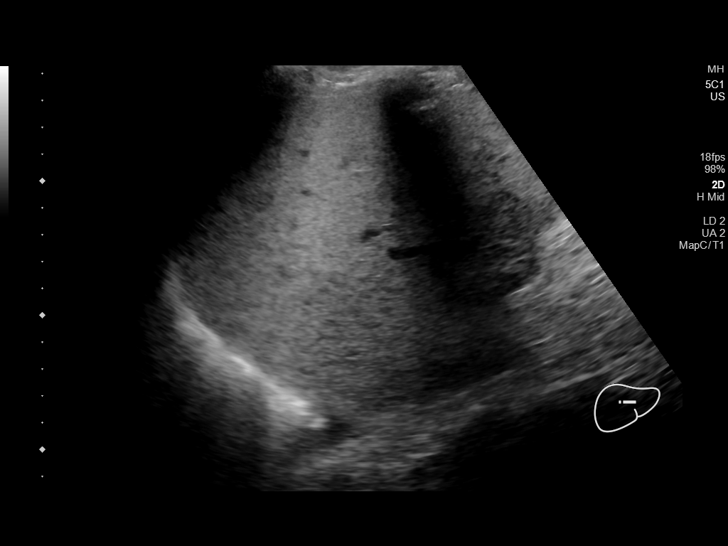
[im 90/99]
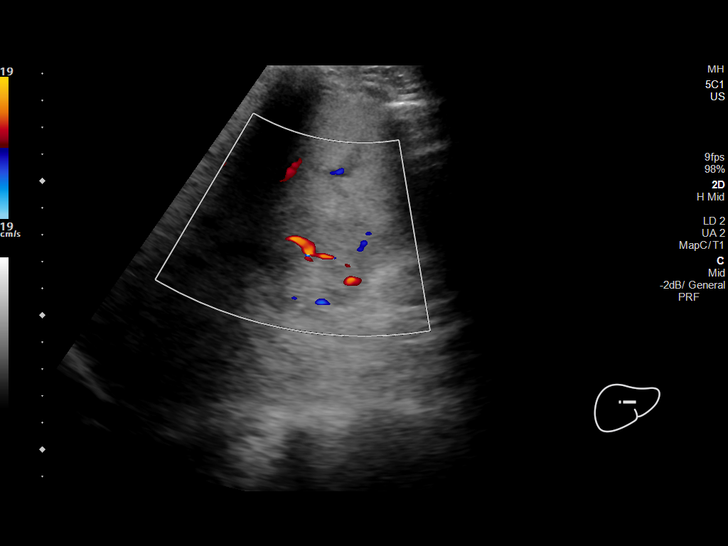
[im 99/99]
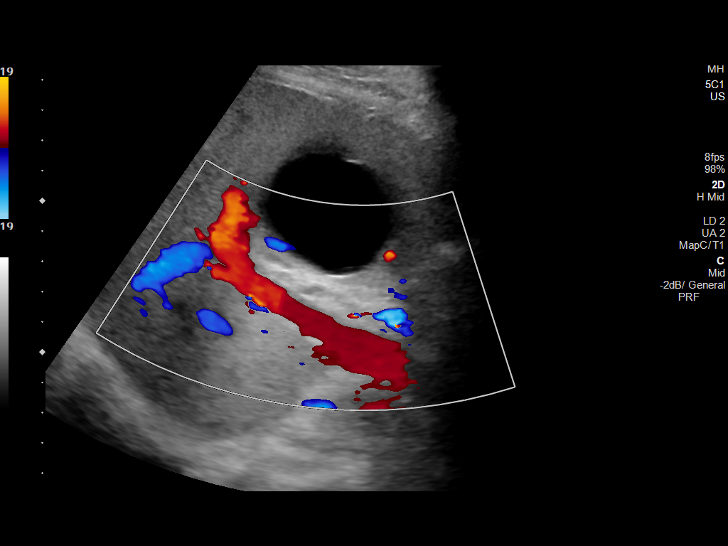

[13 of 25 positions shown; findings below may reference images not displayed]

FINDINGS: Gallbladder:

There is a 3 mm echogenic focus without posterior shadowing within
the gallbladder which may represent a small polyp. No shadowing
stone. There is no gallbladder wall thickening or pericholecystic
fluid. Negative sonographic Murphy's sign.

Common bile duct:

Diameter: 5 mm

Liver:

There is diffuse increased liver echogenicity most commonly seen in
the setting of fatty infiltration. Superimposed inflammation or
fibrosis is not excluded. Clinical correlation is recommended. There
is a 5.0 x 4.6 x 5.2 cm cyst in the liver. A 4.7 x 4.0 x 2.8 cm
heterogeneous hypoechoic area in the right lobe of the liver most
consistent with an area of fatty spurring. A 1.8 x 1.3 cm rounded
echogenic area within this hypoechoic region may represent fatty
liver parenchyma although a mass is not entirely excluded. This area
of heterogeneity appears to have been present on the chest CT of
08/19/2015. Short-term follow-up with ultrasound in 3 months or
further evaluation with MRI without and with contrast is
recommended. Portal vein is patent on color Doppler imaging with
normal direction of blood flow towards the liver.

Other: There is a small subhepatic ascites.
IMPRESSION: 1. Fatty liver with an area of fat sparing in the right lobe of the
liver (details above). Underlying mass is not excluded. Short-term
follow-up with ultrasound in 3 months or further evaluation with MRI
without and with contrast is recommended.
2. A 3 mm gallbladder polyp.
3. Patent main portal vein with hepatopetal flow.
